# Patient Record
Sex: Male | Born: 1950 | Hispanic: No | Marital: Married | Smoking: Never smoker
Health system: Southern US, Community
[De-identification: ages and names within clinical notes are randomized; demographics above are authoritative.]

## PROBLEM LIST (undated history)

## (undated) DIAGNOSIS — E785 Hyperlipidemia, unspecified: Secondary | ICD-10-CM

## (undated) DIAGNOSIS — M542 Cervicalgia: Secondary | ICD-10-CM

## (undated) DIAGNOSIS — K219 Gastro-esophageal reflux disease without esophagitis: Secondary | ICD-10-CM

## (undated) DIAGNOSIS — M8580 Other specified disorders of bone density and structure, unspecified site: Secondary | ICD-10-CM

## (undated) HISTORY — PX: CARDIAC CATHETERIZATION: SHX172

## (undated) HISTORY — DX: Other specified disorders of bone density and structure, unspecified site: M85.80

## (undated) HISTORY — PX: ROTATOR CUFF REPAIR: SHX139

## (undated) HISTORY — PX: ANKLE SURGERY: SHX546

## (undated) HISTORY — PX: FINGER SURGERY: SHX640

## (undated) HISTORY — DX: Gastro-esophageal reflux disease without esophagitis: K21.9

## (undated) HISTORY — DX: Hyperlipidemia, unspecified: E78.5

## (undated) HISTORY — DX: Cervicalgia: M54.2

---

## 2004-04-19 ENCOUNTER — Ambulatory Visit: Payer: Self-pay | Admitting: Internal Medicine

## 2008-05-28 ENCOUNTER — Ambulatory Visit: Payer: Self-pay | Admitting: Internal Medicine

## 2009-02-25 ENCOUNTER — Ambulatory Visit: Payer: Self-pay | Admitting: Neurology

## 2009-03-01 ENCOUNTER — Ambulatory Visit: Payer: Self-pay | Admitting: Neurology

## 2009-03-04 ENCOUNTER — Ambulatory Visit: Payer: Self-pay | Admitting: Neurology

## 2009-04-23 ENCOUNTER — Ambulatory Visit: Payer: Self-pay | Admitting: Psychology

## 2011-09-25 ENCOUNTER — Ambulatory Visit
Admission: RE | Admit: 2011-09-25 | Discharge: 2011-09-25 | Disposition: A | Discharge status: Home / Self Care | Payer: BC Managed Care – PPO | Source: Ambulatory Visit | Attending: Chiropractic Medicine | Admitting: Chiropractic Medicine

## 2011-09-25 ENCOUNTER — Other Ambulatory Visit: Payer: Self-pay | Admitting: Chiropractic Medicine

## 2011-09-25 DIAGNOSIS — M5412 Radiculopathy, cervical region: Secondary | ICD-10-CM

## 2011-09-25 DIAGNOSIS — M542 Cervicalgia: Secondary | ICD-10-CM

## 2012-11-15 ENCOUNTER — Ambulatory Visit: Payer: Self-pay | Admitting: Orthopedic Surgery

## 2013-05-05 DIAGNOSIS — R413 Other amnesia: Secondary | ICD-10-CM

## 2013-06-05 ENCOUNTER — Ambulatory Visit: Payer: Self-pay | Admitting: Orthopedic Surgery

## 2013-06-18 ENCOUNTER — Ambulatory Visit: Payer: Self-pay | Admitting: Orthopedic Surgery

## 2013-06-18 LAB — CBC
HCT: 45.1 % (ref 40.0–52.0)
HGB: 14.8 g/dL (ref 13.0–18.0)
MCH: 28.5 pg (ref 26.0–34.0)
MCHC: 32.8 g/dL (ref 32.0–36.0)
MCV: 87 fL (ref 80–100)
PLATELETS: 273 10*3/uL (ref 150–440)
RBC: 5.18 10*6/uL (ref 4.40–5.90)
RDW: 14.2 % (ref 11.5–14.5)
WBC: 7.1 10*3/uL (ref 3.8–10.6)

## 2013-06-18 LAB — URINALYSIS, COMPLETE
BACTERIA: NONE SEEN
Bilirubin,UR: NEGATIVE
Blood: NEGATIVE
GLUCOSE, UR: NEGATIVE mg/dL (ref 0–75)
Ketone: NEGATIVE
LEUKOCYTE ESTERASE: NEGATIVE
Nitrite: NEGATIVE
PH: 6 (ref 4.5–8.0)
Protein: NEGATIVE
RBC, UR: NONE SEEN /HPF (ref 0–5)
SPECIFIC GRAVITY: 1.016 (ref 1.003–1.030)
SQUAMOUS EPITHELIAL: NONE SEEN
WBC UR: NONE SEEN /HPF (ref 0–5)

## 2013-06-18 LAB — BASIC METABOLIC PANEL
Anion Gap: 5 — ABNORMAL LOW (ref 7–16)
BUN: 22 mg/dL — AB (ref 7–18)
CALCIUM: 8.9 mg/dL (ref 8.5–10.1)
CHLORIDE: 106 mmol/L (ref 98–107)
CREATININE: 0.82 mg/dL (ref 0.60–1.30)
Co2: 26 mmol/L (ref 21–32)
EGFR (Non-African Amer.): >60
GLUCOSE: 93 mg/dL (ref 65–99)
Osmolality: 277 (ref 275–301)
Potassium: 3.9 mmol/L (ref 3.5–5.1)
Sodium: 137 mmol/L (ref 136–145)

## 2013-06-18 LAB — PROTIME-INR
INR: 0.9
Prothrombin Time: 11.8 secs (ref 11.5–14.7)

## 2013-06-18 LAB — APTT: ACTIVATED PTT: 28.5 s (ref 23.6–35.9)

## 2013-06-25 ENCOUNTER — Ambulatory Visit: Payer: Self-pay | Admitting: Orthopedic Surgery

## 2013-07-03 ENCOUNTER — Ambulatory Visit: Payer: Self-pay | Admitting: Pain Medicine

## 2013-07-23 ENCOUNTER — Ambulatory Visit: Payer: Self-pay | Admitting: Specialist

## 2014-06-27 NOTE — Op Note (Signed)
PATIENT NAME:  Kyle Mack, Kyle Mack MR#:  [id number] DATE OF BIRTH:  April 15, 1950  DATE OF PROCEDURE:  07/23/2013  PREOPERATIVE DIAGNOSIS:  Comminuted intra-articular fracture of right ring finger, the DIP joint distal phalanx.   POSTOPERATIVE DIAGNOSIS:  Comminuted intra-articular fracture of right ring finger, the DIP joint distal phalanx.   OPERATION: Open reduction internal fixation intra-articular fracture right distal phalanx, right ring finger    SURGEON: Valinda HoarHoward E. Acelin Ferdig, MD  ANESTHESIA: IV regional.   COMPLICATIONS: None.   DRAINS: None.   OPERATIVE PROCEDURE: The patient was brought to the operating room where he underwent satisfactory IV regional anesthesia in the right arm. The right arm was prepped and draped in sterile fashion and a Z-shaped incision was made over the volar aspect of the ring finger starting from the PIP flexion crease ulnarly going radially to the DIP joint and then ulnarly out to the tip of the finger. Blunt dissection was carried out in the midline and the neurovascular bundle radially was identified and protected. The fracture fragment from the base of the distal phalanx was visualized along with its profundus tendon attachment. This was irrigated and suctioned free of clot. A curette was used to free this up. The remainder of the distal phalanx had a vertical fracture but this was anatomic.  About 50% of the joint surface was contained on the volar fragment. A 0.45 K wire was drilled distally to proximally across the distal phalanx down into the middle phalanx to stabilize the phalanx. Two Mellody DanceKeith needles were then drilled through the avulsed volar fragment and then drilled a 2-0 Prolene suture brought up through this fragment with the FarleyKeith needles. The BurleyKeith needles were then drilled out through the distal portion of the phalanx up through the nail bed and the Prolene sutures were again brought up through this with needles over the top of the finger. By doing so, we were  able to pull the displaced fragment back into anatomic position in its bed. The Prolene suture was then tied over a felt protector and button bringing the avulsed volar fragment securely into position. Fluoroscopy showed the fracture fragments and pinned to be in excellent position. The pin was bent over, cut, and buried. The wound was irrigated several times during the procedure. The tourniquet was deflated with good return of blood flow to the hand at this point prior to closure and patient had good bleeding at the tip of the finger. The main wound was then closed with 5-0 nylon sutures.  After that, Marcaine was placed at the MP level to block the finger for postoperative pain release. A dry sterile dressing with a dorsal splint was applied. The patient was transferred to a stretcher and taken to recovery in good condition.    ____________________________ Valinda HoarHoward E. Jan Walters, MD hem:dd D: 07/23/2013 18:06:14 ET T: 07/23/2013 20:55:48 ET JOB#: [id number]  cc: Valinda HoarHoward E. Kenyatte Gruber, MD, <Dictator> Valinda HoarHOWARD E Oluwaferanmi Wain MD ELECTRONICALLY SIGNED 07/24/2013 1:47

## 2014-06-27 NOTE — Op Note (Signed)
PATIENT NAME:  Kyle Mack, Kyle Mack MR#:  [id number] DATE OF BIRTH:  February 06, 1951  DATE OF PROCEDURE:  06/25/2013  PREOPERATIVE DIAGNOSES: Left shoulder rotator cuff tear, impingement and acromioclavicular joint arthrosis.   POSTOPERATIVE DIAGNOSIS: Left shoulder rotator cuff tear, impingement and acromioclavicular joint arthrosis.   PROCEDURES PERFORMED:  Left shoulder arthroscopy, subacromial decompression, distal clavicle excision and mini-open rotator cuff repair.   ANESTHESIA:  General endotracheal intubation with local injection of 1% lidocaine plain at the incision sites and 0.25% Marcaine plain at the conclusion of the case.   SURGEON:  Thornton Park, M.D.   ESTIMATED BLOOD LOSS:  Minimal.   COMPLICATIONS:  None.  IMPLANTS:  ArthroCare Opus Magnum 2 anchors x 4 and Magnum M anchors x 2.   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 He has had persistent left shoulder pain which has not been responsive to conservative management.  An MRI has confirmed a full-thickness tear of the left supraspinatus. The patient wished to proceed with surgery. I reviewed the risks and benefits of surgery with the patient prior to the date of operation.   PROCEDURE NOTE:  The patient was met in the preoperative area. I marked the word "yes" over the left shoulder.  I updated the patient's history and physical. I answered all questions by the patient and his wife. The patient was brought to the operating room where he underwent general endotracheal intubation. A spider arm positioner was used for this case and he was positioned in a beach chair position. Examination under anesthesia was performed demonstrating no instability and full passive range of motion.   The patient was prepped and draped in a sterile fashion. A timeout was performed to verify the patient's name, date of birth, medical record number, correct side of the surgery and  correct procedure to be performed. It was also used to verify the patient had received the antibiotics and that all appropriate instruments, implants and radiographic studies were available in the room. Once all who were in attendance were in agreement, the case began.   The patient had his anatomic landmarks drawn out with a surgical marker. This included proposed arthroscopy incisions. These were preinjected with 1% lidocaine plain. An 11 blade was used to create the posterior portal through which the arthroscope was placed into the glenohumeral joint. Under direct visualization, an anterior portal was also created using an 18-gauge spinal needle for localization. A 5.75 mm cannula was placed through the anterior portal. A full diagnostic examination of the shoulder was undertaken.   Findings on arthroscopy included a full-thickness tear of the rotator cuff. The biceps tendon was intact. There was mild fraying of the supraspinatus without full-thickness tear. There was no significant glenoid or humeral head chondral lesions. There were no loose bodies in the inferior recess. There was no evidence of labral tear.   Arthroscopic photos of the diagnostic exam were taken. The arthroscope was then placed into the subacromial space. The patient had significant bursitis. A lateral portal was created, again using an 18-gauge spinal needle for localization. A bursectomy was performed to allow for visualization. A subacromial decompression was then performed using a 5.5 mm resector shaver blade through the lateral portal.  The resector shaver blade was then placed through the anterior portal and a distal clavicle excision was undertaken for [facility] joint arthrosis. Through the lateral portal 2 Smart stitches were placed along the lateral edge of the rotator cuff.   All arthroscopic  instruments were then removed. A saber-type incision was made along the lateral border of the acromion. The deltoid was then identified and  split in line with its fibers. The Smart stitches were brought out through the deltoid split incision. A shoulder retractor was placed to allow for adequate visualization. A third Smart stitch was placed in the lateral border of the rotator cuff. Two Magnum M anchors were then placed along the articular margin of the humeral head. This was after the greater tuberosity was debrided using a 5.5 mm resector shaver blade. The sutures for the Magnum M anchors were then passed through the medial aspect of the cuff. Then, the 3 Magnum 2 anchors were used to affix the Smart stitches for lateral row fixation. The Magnum M anchor sutures were then tied in place for medial row fixation. The double row construct was then completed. Final arthroscopic images were taken. The shoulder was rotated and the construct moved as a single unit. There was no evidence of dog ears at the edges of the cuff repair. The wound was copiously irrigated. The deltoid fascia was closed with 0 interrupted Vicryl, the subcutaneous tissue closed with 2-0 Vicryl including all 3 portals. The portal incisions were closed 4-0 nylon and the saber incision skin was closed with a running 4-0 Monocryl.   A dry sterile dressing was applied, TENS unit was also applied, along with a Polar Care sleeve  and an abduction sling. The patient was then awoken and brought to the PACU in stable condition.   I was scrubbed and present for the entire case, and all sharp and instrument counts were correct at the conclusion of the case. I spoke with the patient's wife postoperatively to let her know that the case had gone without complication and the patient was stable in the recovery room.    ____________________________ Timoteo Gaul, MD klk:dmm D: 07/02/2013 12:53:59 ET T: 07/02/2013 13:41:12 ET JOB#: [id number]  cc: Timoteo Gaul, MD, <Dictator> Timoteo Gaul MD ELECTRONICALLY SIGNED 07/07/2013 7:14

## 2015-02-08 ENCOUNTER — Other Ambulatory Visit: Payer: Self-pay | Admitting: Pain Medicine

## 2015-02-08 ENCOUNTER — Encounter: Payer: Self-pay | Admitting: Pain Medicine

## 2015-02-08 DIAGNOSIS — M4722 Other spondylosis with radiculopathy, cervical region: Secondary | ICD-10-CM

## 2015-02-08 DIAGNOSIS — M79603 Pain in arm, unspecified: Secondary | ICD-10-CM

## 2015-02-08 DIAGNOSIS — M542 Cervicalgia: Secondary | ICD-10-CM

## 2015-02-08 DIAGNOSIS — G8929 Other chronic pain: Secondary | ICD-10-CM | POA: Insufficient documentation

## 2015-02-08 DIAGNOSIS — M5412 Radiculopathy, cervical region: Secondary | ICD-10-CM

## 2015-02-15 ENCOUNTER — Ambulatory Visit: Payer: Self-pay | Admitting: Pain Medicine

## 2015-02-17 ENCOUNTER — Ambulatory Visit
Admission: RE | Admit: 2015-02-17 | Discharge: 2015-02-17 | Disposition: A | Discharge status: Home / Self Care | Payer: Medicare Other | Source: Ambulatory Visit | Attending: Pain Medicine | Admitting: Pain Medicine

## 2015-02-17 DIAGNOSIS — M4802 Spinal stenosis, cervical region: Secondary | ICD-10-CM | POA: Insufficient documentation

## 2015-02-17 DIAGNOSIS — M5412 Radiculopathy, cervical region: Secondary | ICD-10-CM | POA: Diagnosis present

## 2015-02-17 DIAGNOSIS — M542 Cervicalgia: Secondary | ICD-10-CM | POA: Insufficient documentation

## 2015-02-17 DIAGNOSIS — M4722 Other spondylosis with radiculopathy, cervical region: Secondary | ICD-10-CM

## 2015-02-18 ENCOUNTER — Ambulatory Visit: Payer: Medicare Other | Attending: Pain Medicine | Admitting: Pain Medicine

## 2015-02-18 ENCOUNTER — Encounter: Payer: Self-pay | Admitting: Pain Medicine

## 2015-02-18 ENCOUNTER — Telehealth: Payer: Self-pay

## 2015-02-18 VITALS — BP 110/72 | HR 66 | Temp 98.7°F | Resp 16 | Ht 68.0 in | Wt 200.0 lb

## 2015-02-18 DIAGNOSIS — M8580 Other specified disorders of bone density and structure, unspecified site: Secondary | ICD-10-CM | POA: Insufficient documentation

## 2015-02-18 DIAGNOSIS — M542 Cervicalgia: Secondary | ICD-10-CM | POA: Insufficient documentation

## 2015-02-18 DIAGNOSIS — M5412 Radiculopathy, cervical region: Secondary | ICD-10-CM

## 2015-02-18 DIAGNOSIS — M4802 Spinal stenosis, cervical region: Secondary | ICD-10-CM

## 2015-02-18 DIAGNOSIS — M792 Neuralgia and neuritis, unspecified: Secondary | ICD-10-CM

## 2015-02-18 DIAGNOSIS — R937 Abnormal findings on diagnostic imaging of other parts of musculoskeletal system: Secondary | ICD-10-CM

## 2015-02-18 DIAGNOSIS — M47812 Spondylosis without myelopathy or radiculopathy, cervical region: Secondary | ICD-10-CM | POA: Insufficient documentation

## 2015-02-18 DIAGNOSIS — M4722 Other spondylosis with radiculopathy, cervical region: Secondary | ICD-10-CM | POA: Insufficient documentation

## 2015-02-18 DIAGNOSIS — M25512 Pain in left shoulder: Secondary | ICD-10-CM | POA: Insufficient documentation

## 2015-02-18 DIAGNOSIS — M47892 Other spondylosis, cervical region: Secondary | ICD-10-CM

## 2015-02-18 DIAGNOSIS — R52 Pain, unspecified: Secondary | ICD-10-CM | POA: Diagnosis present

## 2015-02-18 DIAGNOSIS — M79602 Pain in left arm: Secondary | ICD-10-CM

## 2015-02-18 DIAGNOSIS — G8929 Other chronic pain: Secondary | ICD-10-CM | POA: Diagnosis not present

## 2015-02-18 DIAGNOSIS — K219 Gastro-esophageal reflux disease without esophagitis: Secondary | ICD-10-CM | POA: Diagnosis not present

## 2015-02-18 NOTE — Progress Notes (Signed)
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 His primarily concern today is the Neck Pain and Pain     The patient returns to the clinic today to discuss the results of his recent 02/17/2015 cervical MRI, as well as possible alternatives to treating his left shoulder pain and neck pain. The MRI shows foraminal stenosis at the C3-4 level on the right side, bilateral at the C5-6 level with the right being worst on the left, and on the left side at the C6-7 level. Because his primary pain is over the left posterior trapezius and shoulder area, this would suggest that if foraminal stenosis was involved it would be from the C5-6 and/or C6-7. This was suggested involvement of the C6 or C7 nerve roots, but the pain does not seem to go all the way down into his arm.  A second possibility would be that this pain is referred from his facet joints. For this reason, today I have put at the possibility that we may need to do a diagnostic left sided cervical facet block under fluoroscopic guidance to see if this is much contributing to the pain. Should he get good relief of the pain for the duration of local anesthetic, he may be a good candidate for radiofrequency ablation of  the cervical medial branch nerves.  To be certain that this is not something that can be surgically fixed, we will request a consult with Dr. Hilda LiasErnesto Botero for evaluation of his case and MRI.  Today's Pain Score: 2  Reported level of pain is compatible with clinical observation Pain Type: Chronic pain Pain Descriptors / Indicators: Dull Pain Frequency: Intermittent   Pharmacotherapy Review: The patient is currently using extended release gabapentin (Horizon) to manage his pain. Side-effects or Adverse reactions: None reported Effectiveness: Described as relatively effective, allowing for increase in activities of daily living (ADL) Duration of action: Within normal limits for medication Treatment compliance: Compliant Substance Use Disorder (SUD) Risk Level: Low Pharmacologic Plan: Continue therapy as is  Lab Work: Inflammation Markers No results found for: ESRSEDRATE, CRP  Renal Function Lab Results  Component Value Date   BUN 22* 06/18/2013   CREATININE 0.82 06/18/2013   GFRAA >60 06/18/2013   GFRNONAA >60 06/18/2013    Hepatic Function No results found for: AST, ALT, ALBUMIN  Electrolytes Lab Results  Component Value Date   NA 137 06/18/2013   K 3.9 06/18/2013   CL 106 06/18/2013   CALCIUM 8.9 06/18/2013    Illicit Drugs No results found for: THCU, COCAINSCRNUR, PCPSCRNUR, MDMA, AMPHETMU, METHADONE, ETOH  Allergies:  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  Meds:  The patient has a current medication list which includes the following prescription(s): vitamin c, aspirin ec, calcium-vitamin d3, esomeprazole, gabapentin, rosuvastatin, vitamin d (ergocalciferol), and ciprofloxacin. 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 Family: family history includes COPD in his mother; Heart disease in his father. Surgical:  has past surgical history that includes Rotator cuff repair (Left); Ankle surgery (Right); and Finger surgery (Right, 4th digit). Tobacco:  reports that he has never smoked. He does not have any smokeless tobacco history on file. Alcohol:  reports that he does not drink alcohol. Drug:  reports that he does not use illicit drugs.  Physical Exam:  Vitals:  Today's Vitals   02/18/15 1304 02/18/15 1307  BP: 110/72   Pulse: 66   Temp: 98.7 F (37.1 C)   TempSrc: Oral   Resp: 16   Height:  (1.727 m)   Weight: 200 lb (90.719 kg)   SpO2: 98%   PainSc:  2   Calculated BMI: Body mass index is 30.42 kg/(m^2). General appearance: alert, cooperative, appears stated age and no distress Eyes: PERLA Respiratory: No evidence respiratory distress, no audible rales or ronchi and no use of accessory muscles of respiration Neck: no adenopathy, no carotid bruit, no JVD, supple, symmetrical, trachea midline and thyroid not enlarged, symmetric, no tenderness/mass/nodules  Cervical Spine ROM: Decreased range of motion with rotation as well as lateral bending, secondary to mechanical restrictions of his cervical spine. He indicates that he has noticed that his left shoulder pain improves with tilting of his head towards the right side (right lateral bend). In addition he indicates that rotating his head towards the right side for periods of longer than 10 minutes does worsen his left shoulder pain.  Upper Extremities ROM: Adequate bilaterally Neurologic: No reported sensory abnormalities.  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  Earley was seen today for neck pain and pain.  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) (Bilateral, R>L: C5-6) (Left: C6-7) -     Ambulatory referral to Neurosurgery -     CERVICAL EPIDURAL STEROID INJECTION; Standing  Chronic radicular cervical pain  Chronic pain of left upper extremity  Chronic neck pain (Location of Primary Source of Pain) (Bilateral) (L>R)  Cervical spondylosis with radiculopathy (sensory) (Left)  Cervical facet hypertrophy (Right C2-3) (Bilateral, L>R: C6-7) (Bilateral, R>L: C7-T1) -     CERVICAL FACET (MEDIAL BRANCH NERVE BLOCK) ; Standing  Abnormal MRI, cervical spine (02/17/2015)  Neuropathic pain     There are no Patient Instructions on file for this visit. Medications discontinued today:  There are no discontinued medications. Medications administered today:  Mr. Akhtar does not currently have medications on file.  Primary Care Physician: Sherrie Mustache, MD Location: [facility] Note by: Sydnee Levans. Laban Emperor, M.D, DABA, DABAPM, DABPM, DABIPP, FIPP

## 2015-02-18 NOTE — Progress Notes (Deleted)
   Subjective:    Patient ID: Kyle Mack Kyle Mack, male    DOB: 05-31-1950, 64 y.o.   MRN: [id number]  HPI    Review of Systems     Objective:   Physical Exam        Assessment & Plan:

## 2015-02-18 NOTE — Progress Notes (Signed)
Safety precautions to be maintained throughout the outpatient stay will include: orient to surroundings, keep bed in low position, maintain call bell within reach at all times, provide assistance with transfer out of bed and ambulation.  

## 2016-07-03 ENCOUNTER — Other Ambulatory Visit: Payer: Self-pay | Admitting: Pain Medicine

## 2016-07-03 DIAGNOSIS — G8929 Other chronic pain: Secondary | ICD-10-CM

## 2016-07-03 DIAGNOSIS — M792 Neuralgia and neuritis, unspecified: Secondary | ICD-10-CM

## 2016-07-03 DIAGNOSIS — M47812 Spondylosis without myelopathy or radiculopathy, cervical region: Secondary | ICD-10-CM

## 2016-07-03 DIAGNOSIS — M542 Cervicalgia: Secondary | ICD-10-CM

## 2016-07-03 DIAGNOSIS — G894 Chronic pain syndrome: Secondary | ICD-10-CM | POA: Insufficient documentation

## 2016-07-03 MED ORDER — GABAPENTIN ENACARBIL ER 600 MG PO TBCR: EXTENDED_RELEASE_TABLET | ORAL | 5 refills | Status: DC

## 2016-07-03 NOTE — Progress Notes (Unsigned)
The patient was doing great on his Horizant (Long-acting gabapentin) 600 mg in AM and 1200 mg in PM, but the pharmaceutical company no longer supports the financial assistance program and his cost is going up from $120/mo to $1,200/mo. He indicates not being able to afford it at that price. I have given him a prescription for the generic form of it to see if his insurance will call over it. I have also renewed his PRN

## 2016-07-10 ENCOUNTER — Encounter: Payer: Self-pay | Admitting: Nurse Practitioner

## 2016-07-10 ENCOUNTER — Ambulatory Visit: Payer: Medicare Other | Admitting: Nurse Practitioner

## 2016-07-10 ENCOUNTER — Ambulatory Visit: Payer: Medicare Other | Attending: Nurse Practitioner | Admitting: Nurse Practitioner

## 2016-07-10 VITALS — BP 106/66 | HR 69 | Temp 98.0°F | Resp 16 | Ht 68.0 in | Wt 186.0 lb

## 2016-07-10 DIAGNOSIS — M9981 Other biomechanical lesions of cervical region: Secondary | ICD-10-CM | POA: Diagnosis not present

## 2016-07-10 DIAGNOSIS — M792 Neuralgia and neuritis, unspecified: Secondary | ICD-10-CM

## 2016-07-10 DIAGNOSIS — G894 Chronic pain syndrome: Secondary | ICD-10-CM | POA: Insufficient documentation

## 2016-07-10 DIAGNOSIS — E663 Overweight: Secondary | ICD-10-CM | POA: Insufficient documentation

## 2016-07-10 DIAGNOSIS — M4722 Other spondylosis with radiculopathy, cervical region: Secondary | ICD-10-CM | POA: Insufficient documentation

## 2016-07-10 DIAGNOSIS — Z79899 Other long term (current) drug therapy: Secondary | ICD-10-CM | POA: Insufficient documentation

## 2016-07-10 DIAGNOSIS — M25512 Pain in left shoulder: Secondary | ICD-10-CM | POA: Diagnosis not present

## 2016-07-10 DIAGNOSIS — M542 Cervicalgia: Secondary | ICD-10-CM

## 2016-07-10 DIAGNOSIS — M4802 Spinal stenosis, cervical region: Secondary | ICD-10-CM | POA: Diagnosis not present

## 2016-07-10 DIAGNOSIS — G8929 Other chronic pain: Secondary | ICD-10-CM

## 2016-07-10 NOTE — Progress Notes (Signed)
Nursing Pain Medication Assessment:  Safety precautions to be maintained throughout the outpatient stay will include: orient to surroundings, keep bed in low position, maintain call bell within reach at all times, provide assistance with transfer out of bed and ambulation.  Medication Inspection Compliance: Pill count conducted under aseptic conditions, in front of the patient. Neither the pills nor the bottle was removed from the patient's sight at any time. Once count was completed pills were immediately returned to the patient in their original bottle.   

## 2016-07-10 NOTE — Patient Instructions (Addendum)

## 2016-07-10 NOTE — Progress Notes (Signed)
Patient's Name: Kyle Mack  MRN: [id number]  Referring Provider: Casilda Carls  DOB: 03/10/1950  PCP: Casilda Carls  DOS: 07/10/2016  Note by: Vevelyn Francois NP  Service setting: Ambulatory outpatient  Specialty: Interventional Pain Management  Location: [facility] (AMB) Pain Management Facility    Patient type: Established    Primary Reason(s) for Visit: Encounter for prescription drug management (Level of risk: moderate) CC: Neck Pain (mid- left C3-4)  HPI  Kyle Mack is a 66 y.o. year old, male patient, who comes today for a medication management evaluation. 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 His primarily concern today is the Neck Pain (mid- left C3-4)  Pain Assessment: Self-Reported Pain Score: 3 /10             Reported level is compatible with observation.       Pain Type: Chronic pain Pain Location: Neck Pain Orientation: Left Pain Descriptors / Indicators: Constant, Tiring (worse when eating or typing) Pain Frequency: Constant  Kyle Mack was last scheduled for an appointment on 02/18/15 for medication management. During today's appointment we reviewed Kyle Mack chronic pain status, as well as his outpatient medication regimen. He has chronic neck pain. He feels like his cervical stenosis is getting worse. He states that he has left cervical radiculopathy C5-6. He admits that the pain is increased with computer work, typing and just having to hold his head in  certain positions. This is why he only works part-time. He is currently on Horizant BID. He admits that this is very effective in controlling his pain. He states he only has breakthrough pain 3 times per month. However on gabapentin alone he was having to take it QID. It was causing fatigue and he was having a lot of breakthrough pain. He has started Glucosamine and chondroitin secondary to loss in height.  He may have to get the cervical facet block if he is not able to remain on his medication. He admits that he can not afford the Horizant without insurance assistance.   The patient  reports that he does not use drugs. His body mass index is 28.28 kg/m.  Further details on both, my assessment(s), as well as the proposed treatment plan, please see below.  Controlled Substance Pharmacotherapy Assessment REMS (Risk Evaluation and Mitigation Strategy)  Clide Cliff, RN  07/10/2016  8:23 AM  Sign at close encounter Nursing Pain Medication Assessment:  Safety precautions to be maintained throughout the outpatient stay will include: orient to surroundings, keep bed in low position, maintain call bell within reach at all times, provide assistance with transfer out of bed and ambulation.  Medication Inspection Compliance: Pill count conducted under aseptic conditions, in front of the patient. Neither the pills nor the bottle was removed from the patient's sight at any time. Once count was completed pills were immediately returned to the patient in their original bottle.  Pharmacokinetics: Liberation and absorption (onset of action): WNL Distribution (time to peak effect): WNL Metabolism and excretion (duration of action): WNL         Pharmacodynamics: Desired effects: Analgesia: Kyle Mack reports >  50% benefit. Functional ability: Patient reports that medication allows him to accomplish basic ADLs Clinically meaningful improvement in function (CMIF): Sustained CMIF goals met Perceived  effectiveness: Described as relatively effective, allowing for increase in activities of daily living (ADL) Undesirable effects: Side-effects or Adverse reactions: None reported Monitoring: [place] PMP: Online review of the past 37-monthperiod conducted. Compliant with practice rules and regulations List of all UDS test(s) done:  No results found for: TOXASSSELUR, SUMMARY Last UDS on record: No results found for: TOXASSSELUR UDS interpretation: Compliant          Medication Assessment Form: Reviewed. Patient indicates being compliant with therapy Treatment compliance: Compliant Risk Assessment Profile: Aberrant behavior: See prior evaluations. None observed or detected today Comorbid factors increasing risk of overdose: See prior notes. No additional risks detected today Risk of substance use disorder (SUD): Low Opioid Risk Tool (ORT) Total Score: 0  Interpretation Table:  Score <3 = Low Risk for SUD  Score between 4-7 = Moderate Risk for SUD  Score >8 = High Risk for Opioid Abuse   Risk Mitigation Strategies:  Patient Counseling: Covered Patient-Prescriber Agreement (PPA): Present and active  Notification to other healthcare providers: Done  Pharmacologic Plan: No change in therapy, at this time  Laboratory Chemistry  Inflammation Markers No results found for: CRP, ESRSEDRATE (CRP: Acute Phase) (ESR: Chronic Phase) Renal Function Markers Lab Results  Component Value Date   BUN 22 (H) 06/18/2013   CREATININE 0.82 06/18/2013   GFRAA >60 06/18/2013   GFRNONAA >60 06/18/2013   Hepatic Function Markers No results found for: AST, ALT, ALBUMIN, ALKPHOS, HCVAB Electrolytes Lab Results  Component Value Date   NA 137 06/18/2013   K 3.9 06/18/2013   CL 106 06/18/2013   CALCIUM 8.9 06/18/2013   Neuropathy Markers No results found for: [id number] Pathology Markers Lab Results  Component Value Date   CALCIUM 8.9 06/18/2013   Coagulation Parameters Lab Results   Component Value Date   INR 0.9 06/18/2013   LABPROT 11.8 06/18/2013   APTT 28.5 06/18/2013   PLT 273 06/18/2013   Cardiovascular Markers Lab Results  Component Value Date   HGB 14.8 06/18/2013   HCT 45.1 06/18/2013   Note: Lab results reviewed.  Recent Diagnostic Imaging Review  Mr Cervical Spine Wo Contrast  Result Date: 02/17/2015 CLINICAL DATA:  Neck pain, worse on the left.  Left shoulder pain. EXAM: MRI CERVICAL SPINE WITHOUT CONTRAST TECHNIQUE: Multiplanar, multisequence MR imaging of the cervical spine was performed. No intravenous contrast was administered. COMPARISON:  MRI of the cervical spine 06/05/2013 FINDINGS: Normal signal is present in the cervical and upper thoracic spinal cord to the lowest imaged level, T2-3. Marrow signal, vertebral body heights, alignment are normal. The craniocervical junction is within normal limits. The visualized intracranial contents are normal. C2-3: Asymmetric right-sided facet hypertrophy is present. There is no significant stenosis. C3-4: A rightward disc osteophyte complex is present. Uncovertebral spurring is present on the right. This results in mild to moderate right foraminal narrowing. Central canal is patent. C4-5: Minimal uncovertebral spurring is present without significant stenosis. C5-6: Uncovertebral spurring is evident bilaterally. This results in moderate foraminal stenosis, right greater than left. C6-7: Advanced facet hypertrophy is present bilaterally. Uncovertebral spurring on the left contributes to mild left foraminal narrowing. The right foramen is patent. C7-T1: Moderate facet hypertrophy is worse on the right. There is no significant stenosis. IMPRESSION: 1. Mild to moderate right foraminal narrowing at C3-4 is not significantly changed. 2. Moderate foraminal  stenosis at C5-6 demonstrates slight progression. Stenosis is worse on the right. 3. Mild left foraminal narrowing at C6-7 is stable. Electronically Signed   By:  San Morelle M.D.   On: 02/17/2015 15:25   Note: Imaging results reviewed.          Meds  The patient has a current medication list which includes the following prescription(s): aspirin ec, calcium-vitamin d3, dutasteride, gabapentin enacarbil, mirabegron er, rosuvastatin, and vitamin d (ergocalciferol).  Current Outpatient Prescriptions on File Prior to Visit  Medication Sig  . aspirin EC 81 MG tablet Take 81 mg by mouth daily.  . Calcium Carbonate-Vitamin D (CALCIUM-VITAMIN D3) 600-125 MG-UNIT TABS Take by mouth daily.   . Gabapentin Enacarbil 600 MG TBCR Take 600 mg in AM and 1200 mg in PM with meals.  . rosuvastatin (CRESTOR) 10 MG tablet Take 10 mg by mouth 3 (three) times a week.  . Vitamin D, Ergocalciferol, (DRISDOL) 50000 UNITS CAPS capsule Take 50,000 Units by mouth every 7 (seven) days.   No current facility-administered medications on file prior to visit.    ROS  Constitutional: Denies any fever or chills Gastrointestinal: No reported hemesis, hematochezia, vomiting, or acute GI distress Musculoskeletal: Denies any acute onset joint swelling, redness, loss of ROM, or weakness Neurological: No reported episodes of acute onset apraxia, aphasia, dysarthria, agnosia, amnesia, paralysis, loss of coordination, or loss of consciousness  Allergies  Mr. Montz is allergic to alendronate; biphosphate; and nsaids.  [place]  Drug: Mr. Eckerson  reports that he does not use drugs. Alcohol:  reports that he does not drink alcohol. Tobacco:  reports that he has never smoked. He does not have any smokeless tobacco history on file. Medical:  has a past medical history of Cervical spine pain; GERD (gastroesophageal reflux disease); and Osteopenia. Family: family history includes COPD in his mother; Heart disease in his father.  Past Surgical History:  Procedure Laterality Date  . ANKLE SURGERY Right   . FINGER SURGERY Right 4th digit  . ROTATOR CUFF REPAIR Left    Constitutional  Exam  General appearance: Well nourished, well developed, and well hydrated. In no apparent acute distress Vitals:   07/10/16 0810  BP: 106/66  Pulse: 69  Resp: 16  Temp: 98 F (36.7 C)  TempSrc: Oral  SpO2: 98%  Weight: 186 lb (84.4 kg)  Height: 5' 8"  (1.727 m)   BMI Assessment: Estimated body mass index is 28.28 kg/m as calculated from the following:   Height as of this encounter: 5' 8"  (1.727 m).   Weight as of this encounter: 186 lb (84.4 kg).  BMI interpretation table: BMI level Category Range association with higher incidence of chronic pain  <18 kg/m2 Underweight   18.5-24.9 kg/m2 Ideal body weight   25-29.9 kg/m2 Overweight Increased incidence by 20%  30-34.9 kg/m2 Obese (Class I) Increased incidence by 68%  35-39.9 kg/m2 Severe obesity (Class II) Increased incidence by 136%  >40 kg/m2 Extreme obesity (Class III) Increased incidence by 254%   BMI Readings from Last 4 Encounters:  07/10/16 28.28 kg/m  02/18/15 30.41 kg/m   Wt Readings from Last 4 Encounters:  07/10/16 186 lb (84.4 kg)  02/18/15 200 lb (90.7 kg)  Psych/Mental status: Alert, oriented x 3 (person, place, & time)       Eyes: PERLA Respiratory: No evidence of acute respiratory distress  Cervical Spine Exam  Inspection: No masses, redness, or swelling Alignment: Symmetrical Functional ROM: Decreased ROM      Stability: No instability  detected Muscle strength & Tone: Functionally intact Sensory: Unimpaired Palpation: No palpable anomalies              Upper Extremity (UE) Exam    Side: Right upper extremity  Side: Left upper extremity  Inspection: No masses, redness, swelling, or asymmetry. No contractures  Inspection: No masses, redness, swelling, or asymmetry. No contractures  Functional ROM: Unrestricted ROM          Functional ROM: Unrestricted ROM          Muscle strength & Tone: Functionally intact  Muscle strength & Tone: Functionally intact  Sensory: Unimpaired  Sensory: Unimpaired   Palpation: No palpable anomalies              Palpation: No palpable anomalies              Specialized Test(s): Deferred         Specialized Test(s): Deferred           Gait & Posture Assessment  Ambulation: Unassisted Gait: Relatively normal for age and body habitus Posture: WNL   Assessment  Primary Diagnosis & Pertinent Problem List: The primary encounter diagnosis was Chronic neck pain (Location of Primary Source of Pain) (Bilateral) (L>R). 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  Status Diagnosis  Persistent Persistent Persistent 1. Chronic neck pain (Location of Primary Source of Pain) (Bilateral) (L>R)   2. 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 Cervical spondylosis with radiculopathy (sensory) (Left)   4. Neurogenic pain   5. Chronic pain syndrome   6. Overweight (BMI 25.0-29.9)      Plan of Care  Pharmacotherapy (Medications Ordered): No orders of the defined types were placed in this encounter.  New Prescriptions   No medications on file   Medications administered today: Mr. Busk had no medications administered during this visit. Lab-work, procedure(s), and/or referral(s): No orders of the defined types were placed in this encounter.  Imaging and/or referral(s): None  Interventional therapies: Planned, scheduled, and/or pending:   Not at this time.    Considering:   Diagnostic Cervical Facet Block, Left C3, C4, C5, C6, C7, T1   Palliative PRN treatment(s):   Palliative Cervical Facet Block, Left C3, C4, C5, C6, C7, T1   Provider-requested follow-up: Return in about 3 months (around 10/10/2016) for Medication Mgmt.  No future appointments. Primary Care Physician: Casilda Carls Location: [facility]  Outpatient Pain Management Facility Note by: Vevelyn Francois NP Date: 07/10/2016; Time: 9:01 AM  Pain Score Disclaimer: We use the NRS-11 scale. This is a self-reported, subjective measurement of pain severity with only modest accuracy. It is used primarily to identify changes within a particular patient. It must be understood that outpatient pain scales are significantly less accurate that those used for research, where they can be applied under ideal controlled circumstances with minimal exposure to variables. In reality, the score is likely to be a combination of pain intensity and pain affect, where pain affect describes the degree of emotional arousal or changes in action readiness caused by the sensory experience of pain. Factors such as social and work situation, setting, emotional state, anxiety levels, expectation, and prior pain experience may influence pain perception and show large inter-individual differences that may also be affected by time variables.  Patient instructions provided during this appointment: Patient  Instructions  ____________________________________________________________________________________________  Medication Rules  Applies to: All patients receiving prescriptions (written or electronic).  Pharmacy of record: Pharmacy where electronic prescriptions will be sent. If written prescriptions are taken to a different pharmacy, please inform the nursing staff. The pharmacy listed in the electronic medical record should be the one where you would like electronic prescriptions to be sent.  Prescription refills: Only during scheduled appointments. Applies to both, written and electronic prescriptions.  NOTE: The following applies primarily to controlled substances (Opioid Pain Medications)  Patient's responsibilities: 1. Pain Pills: Bring all pain pills to every appointment (except for procedure appointments). 2. Pill Bottles: Bring pills in original pharmacy bottle.  Always bring newest bottle. Bring bottle, even if empty. 3. Medication refills: You are responsible for knowing and keeping track of what medications you need refilled. The day before your appointment, write a list of all prescriptions that need to be refilled. Bring that list to your appointment and give it to the admitting nurse. Prescriptions will be written only during appointments. If you forget a medication, it will not be "Called in", "Faxed", or "electronically sent". You will need to get another appointment to get these prescribed. 4. Prescription Accuracy: You are responsible for carefully inspecting your prescriptions before leaving our office. Have the discharge nurse carefully go over each prescription with you, before taking them home. Make sure that your name is accurately spelled, that your address is correct. Check the name and dose of your medication to make sure it is accurate. Check the number of pills, and the written instructions to make sure they are clear and accurate. Make sure that you are given enough medication to last until your next medication refill appointment. 5. Taking Medication: Take medication as prescribed. Never take more pills than instructed. Never take medication more frequently than prescribed. Taking less pills or less frequently is permitted and encouraged, when it comes to controlled substances (written prescriptions).  6. Inform other Doctors: Always inform, all of your healthcare providers, of all the medications you take. 7. Pain Medication from other Providers: You are not allowed to accept any additional pain medication from any other Doctor or Healthcare provider. There are two exceptions to this rule. (see below) In the event that you require additional pain medication, you are responsible for notifying us, as stated below. 8. Medication Agreement: You are responsible for carefully reading and following our Medication Agreement. This must be signed before  receiving any prescriptions from our practice. Safely store a copy of your signed Agreement. Violations to the Agreement will result in no further prescriptions. (Additional copies of our Medication Agreement are available upon request.) 9. Laws, Rules, & Regulations: All patients are expected to follow all Federal and [employer], Rules, [employer]. Ignorance of the Laws does not constitute a valid excuse.  Exceptions: There are only two exceptions to the rule of not receiving pain medications from other Healthcare Providers. 1. Exception #1 (Emergencies): In the event of an emergency (i.e.: accident requiring emergency care), you are allowed to receive additional pain medication. However, you are responsible for: As soon as you are able, call our office [phone], at any time of the day or night, and leave a message stating your name, the date and nature of the emergency, and the name and dose of the medication prescribed. In the event that your call is answered by a member of our staff, make sure to document and save the date, time, and the name of the  person that took your information.  2. Exception #2 (Planned Surgery): In the event that you are scheduled by another doctor or dentist to have any type of surgery or procedure, you are allowed (for a period no longer than 30 days), to receive additional pain medication, for the acute post-op pain. However, in this case, you are responsible for picking up a copy of our "Post-op Pain Management for Surgeons" handout, and giving it to your surgeon or dentist. This document is available at our office, and does not require an appointment to obtain it. Simply go to our office during business hours (Monday-Thursday from 8:00 AM to 4:00 PM) (Friday 8:00 AM to 12:00 Noon) or if you have a scheduled appointment with [place], prior to your surgery, and ask for it by name. In addition, you will need to provide [place] with your name, name of your surgeon, type of  surgery, and date of procedure or surgery.  ____________________________________________________________________________

## 2016-07-17 ENCOUNTER — Other Ambulatory Visit: Payer: Self-pay

## 2016-07-20 ENCOUNTER — Telehealth: Payer: Self-pay | Admitting: Nurse Practitioner

## 2016-07-20 ENCOUNTER — Telehealth: Payer: Self-pay | Admitting: *Deleted

## 2016-07-20 NOTE — Telephone Encounter (Signed)
Patient called asking about his Horizant prior auth and wants someone to call him with update.

## 2016-07-20 NOTE — Telephone Encounter (Signed)
Patient came by twice today re; his PA for Horizant.  Patient is upset 1. Because the request sat x 1 week and was not handled.  2.  Because we did not submit the proper dx code in order to secure the drug.  3.  He would like an independent appeal performed in an effort to secure PA.  Spoke with Thad Rangerrystal King, NP and she is going to begin this process this afternoon.

## 2016-07-20 NOTE — Telephone Encounter (Signed)
Patient notified per voicemail that appeal for Horizant was denied.

## 2016-07-24 ENCOUNTER — Telehealth: Payer: Self-pay | Admitting: *Deleted

## 2016-07-24 ENCOUNTER — Encounter: Payer: Self-pay | Admitting: Nurse Practitioner

## 2016-07-24 DIAGNOSIS — M792 Neuralgia and neuritis, unspecified: Secondary | ICD-10-CM

## 2016-07-24 MED ORDER — GABAPENTIN ENACARBIL ER 600 MG PO TBCR: EXTENDED_RELEASE_TABLET | ORAL | 3 refills | Status: DC

## 2016-07-24 NOTE — Telephone Encounter (Signed)
Returned call to [phone]. Left message.Will look for additional phone numbers

## 2016-07-25 ENCOUNTER — Other Ambulatory Visit: Payer: Self-pay | Admitting: Nurse Practitioner

## 2016-07-25 MED ORDER — GABAPENTIN 300 MG PO CAPS: 300.0000 mg | ORAL_CAPSULE | Freq: Four times a day (QID) | ORAL | 3 refills | Status: DC

## 2016-10-10 ENCOUNTER — Encounter: Payer: Medicare Other | Admitting: Nurse Practitioner

## 2018-11-18 ENCOUNTER — Encounter: Payer: Self-pay | Admitting: Cardiovascular Disease

## 2018-11-20 ENCOUNTER — Telehealth: Payer: Self-pay | Admitting: Cardiovascular Disease

## 2018-11-20 NOTE — Telephone Encounter (Signed)
Crystal calling in from Dr. Guerry Bruin office to discuss referral of patient.He is requesting to speak with Dr. Fletcher Anon. Please contact Dr. Rosario Jacks at [phone]

## 2018-11-22 ENCOUNTER — Telehealth (INDEPENDENT_AMBULATORY_CARE_PROVIDER_SITE_OTHER): Payer: Medicare Other | Admitting: Cardiovascular Disease

## 2018-11-22 ENCOUNTER — Other Ambulatory Visit: Payer: Self-pay

## 2018-11-22 ENCOUNTER — Telehealth: Payer: Self-pay

## 2018-11-22 VITALS — BP 115/75 | HR 65 | Temp 96.7°F | Ht 69.0 in | Wt 189.0 lb

## 2018-11-22 DIAGNOSIS — R079 Chest pain, unspecified: Secondary | ICD-10-CM

## 2018-11-22 DIAGNOSIS — R072 Precordial pain: Secondary | ICD-10-CM

## 2018-11-22 DIAGNOSIS — R931 Abnormal findings on diagnostic imaging of heart and coronary circulation: Secondary | ICD-10-CM

## 2018-11-22 DIAGNOSIS — E785 Hyperlipidemia, unspecified: Secondary | ICD-10-CM

## 2018-11-22 DIAGNOSIS — R002 Palpitations: Secondary | ICD-10-CM

## 2018-11-22 MED ORDER — METOPROLOL TARTRATE 25 MG PO TABS: ORAL_TABLET | ORAL | 0 refills | Status: DC

## 2018-11-22 NOTE — Progress Notes (Signed)
Virtual Visit via Video Note   This visit type was conducted due to national recommendations for restrictions regarding the COVID-19 Pandemic (e.g. social distancing) in an effort to limit this patient's exposure and mitigate transmission in our community.  Due to his co-morbid illnesses, this patient is at least at moderate risk for complications without adequate follow up.  This format is felt to be most appropriate for this patient at this time.  All issues noted in this document were discussed and addressed.  A limited physical exam was performed with this format.  Please refer to the patient's chart for his consent to telehealth for [facility].   Date:  11/22/2018   ID:  Kyle Mack, DOB August 09, 1950, MRN [id number]  Patient Location: Home Provider Location: Office  PCP:  Sherrie Mustache, MD  Cardiologist:  No primary care provider on file.  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 Dario Guardian for evaluation of abnormal nuclear stress test.  He reports having cardiac catheterization in 1999 in [place] and was told that it was normal.  At that time, it was done due to chest pain which was later determined to be GI.  He also had prior MRI/MRA of the brain that showed no significant atherosclerosis.  He has known history of hyperlipidemia and strong family history of premature coronary artery disease.  His father died at the age of 58 of myocardial infarction and his brother had myocardial infarction in his late 50s.  His mother had COPD.  He has been on a statin for many years and he is not a smoker.  Recently, he complains of intermittent episodes of palpitations when he lies down on his left side.  This prompted a stress test.  He was able to exercise for 6 minutes and achieved a maximum heart rate 126 bpm.   Perfusion scan showed evidence of inferior and inferoseptal reversible defect suggestive of ischemia in addition to small anterior lateral defect mostly fixed.  These images were done at Dr. Aurelio Brash office but I was able to review them via a standstill image.  These results came as a surprise to the patient who endorses no exertional symptoms.  The patient does not have symptoms concerning for COVID-19 infection (fever, chills, cough, or new shortness of breath).    Past Medical History:  Diagnosis Date   Cervical spine pain    GERD (gastroesophageal reflux disease)    Osteopenia    Past Surgical History:  Procedure Laterality Date   ANKLE SURGERY Right    FINGER SURGERY Right 4th digit   ROTATOR CUFF REPAIR Left      Current Meds  Medication Sig   aspirin EC 81 MG tablet Take 81 mg by mouth daily.   Calcium Carbonate-Vitamin D (CALCIUM-VITAMIN D3) 600-125 MG-UNIT TABS Take by mouth daily.    desonide (DESOWEN) 0.05 % ointment Apply 1 application topically 2 (two) times daily.   dutasteride (AVODART) 0.5 MG capsule Take 0.5 mg by mouth daily.   gabapentin (NEURONTIN) 300 MG capsule Take 1 capsule (300 mg total) by mouth 4 (four) times daily. (Patient taking differently: Take 300 mg by mouth 4 (four) times daily as needed. )   rosuvastatin (CRESTOR) 10 MG tablet Take 10 mg by mouth 3 (three) times a week.   tamsulosin (FLOMAX) 0.4 MG CAPS capsule  Take 0.4 mg by mouth daily.   Vitamin D, Ergocalciferol, (DRISDOL) 50000 UNITS CAPS capsule Take 50,000 Units by mouth every 7 (seven) days.     Allergies:   Alendronate, Biphosphate, and Nsaids   Social History   Tobacco Use   Smoking status: Never Smoker  Substance Use Topics   Alcohol use: No    Alcohol/week: 0.0 standard drinks   Drug use: No     Family Hx: The patient's family history includes COPD in his mother; Heart disease in his father.  ROS:   Please see the history of present illness.     All other  systems reviewed and are negative.   Prior CV studies:   The following studies were reviewed today:  I reviewed the patient's nuclear stress test that was done this week  Labs/Other Tests and Data Reviewed:    EKG:  No ECG reviewed.  Recent Labs: No results found for requested labs within last 8760 hours.   Recent Lipid Panel No results found for: CHOL, TRIG, HDL, CHOLHDL, LDLCALC, LDLDIRECT  Wt Readings from Last 3 Encounters:  11/22/18 189 lb (85.7 kg)  07/10/16 186 lb (84.4 kg)  02/18/15 200 lb (90.7 kg)     Objective:    Vital Signs:  BP 115/75 Comment: taken on 11/18/2018   Pulse 65    Temp (!) 96.7 F (35.9 C)    Ht 5\' 9"  (1.753 m)    Wt 189 lb (85.7 kg)    BMI 27.91 kg/m    VITAL SIGNS:  reviewed GEN:  no acute distress EYES:  sclerae anicteric, EOMI - Extraocular Movements Intact RESPIRATORY:  normal respiratory effort, symmetric expansion SKIN:  no rash, lesions or ulcers. MUSCULOSKELETAL:  no obvious deformities. NEURO:  alert and oriented x 3, no obvious focal deficit PSYCH:  normal affect  ASSESSMENT & PLAN:    1. Abnormal nuclear stress test: I personally reviewed the stress test and I agree that it is abnormal and highly suggestive of inferior wall ischemia in the RCA distribution.  I suspect that the anterolateral fixed defect is likely an artifact.  His ejection fraction was normal.  Overall moderate risk study.  Surprisingly, the patient does not describe significant anginal symptoms.  The test was done due to palpitations in certain positions which seems to be atypical.  He is already on low-dose aspirin.  I discussed with him different management options and suggested either CTA of the coronary arteries or proceeding with left heart catheterization.  Given his minimal symptoms and previous normal cardiac cath in 1999, he prefers a noninvasive approach to start with.  Thus, we will go ahead and schedule him for CTA of the coronary arteries with FFR.  His  resting heart rate is 65 bpm and thus will only give him a small dose metoprolol.  2.  Hyperlipidemia: Currently on rosuvastatin.  If CTA confirmed significant coronary artery disease, will need to be more aggressive with this.  3.  Palpitations: EF was normal on recent stress test.  Evaluation with an outpatient monitor can be considered once ischemic work-up is done.    Time:   Today, I have spent 15 minutes with the patient with telehealth technology discussing the above problems.     Medication Adjustments/Labs and Tests Ordered: Current medicines are reviewed at length with the patient today.  Concerns regarding medicines are outlined above.   Tests Ordered: No orders of the defined types were placed in this encounter.   Medication Changes: No  orders of the defined types were placed in this encounter.   Follow Up: Based on results of stress testing.  Signed, Lorine BearsMuhammad Prabhav Faulkenberry, MD  11/22/2018 11:50 AM    [facility]

## 2018-11-22 NOTE — Telephone Encounter (Signed)
Virtual Visit Pre-Appointment Phone Call  "Kyle Mack, I am calling you today to discuss your upcoming appointment. We are currently trying to limit exposure to the virus that causes COVID-19 by seeing patients at home rather than in the office."  1. "What is the BEST phone number to call the day of the visit?" - include this in appointment notes  2. "Do you have or have access to (through a family member/friend) a smartphone with video capability that we can use for your visit?" a. If yes - list this number in appt notes as "cell" (if different from BEST phone #) and list the appointment type as a VIDEO visit in appointment notes b. If no - list the appointment type as a PHONE visit in appointment notes  3. Confirm consent - "In the setting of the current Covid19 crisis, you are scheduled for a video visit with your provider on 11/22/2018 at 12:00pm.  Just as we do with many in-office visits, in order for you to participate in this visit, we must obtain consent.  If you'd like, I can send this to your mychart (if signed up) or email for you to review.  Otherwise, I can obtain your verbal consent now.  All virtual visits are billed to your insurance company just like a normal visit would be.  By agreeing to a virtual visit, we'd like you to understand that the technology does not allow for your provider to perform an examination, and thus may limit your provider's ability to fully assess your condition. If your provider identifies any concerns that need to be evaluated in person, we will make arrangements to do so.  Finally, though the technology is pretty good, we cannot assure that it will always work on either your or our end, and in the setting of a video visit, we may have to convert it to a phone-only visit.  In either situation, we cannot ensure that we have a secure connection.  Are you willing to proceed?" STAFF: Did the patient verbally acknowledge consent to telehealth visit? Document YES/NO  here: YES  4. Advise patient to be prepared - "Two hours prior to your appointment, go ahead and check your blood pressure, pulse, oxygen saturation, and your weight (if you have the equipment to check those) and write them all down. When your visit starts, your provider will ask you for this information. If you have an Apple Watch or Kardia device, please plan to have heart rate information ready on the day of your appointment. Please have a pen and paper handy nearby the day of the visit as well."  5. Give patient instructions for MyChart download to smartphone OR Doximity/Doxy.me as below if video visit (depending on what platform provider is using)  6. Inform patient they will receive a phone call 15 minutes prior to their appointment time (may be from unknown caller ID) so they should be prepared to answer    TELEPHONE CALL NOTE  Kyle Mack has been deemed a candidate for a follow-up tele-health visit to limit community exposure during the Covid-19 pandemic. I spoke with the patient via phone to ensure availability of phone/video source, confirm preferred email & phone number, and discuss instructions and expectations.  I reminded Kyle Mack to be prepared with any vital sign and/or heart rhythm information that could potentially be obtained via home monitoring, at the time of his visit. I reminded Kyle Mack to expect a phone call prior to his visit.   L  [name] 11/22/2018 11:50 AM   INSTRUCTIONS FOR DOWNLOADING THE MYCHART APP TO SMARTPHONE  - The patient must first make sure to have activated MyChart and know their login information - If Apple, go to [employer] Store and type in MyChart in the search bar and download the app. If Android, ask patient to go to [employer] Play Store and type in ColonMyChart in the search bar and download the app. The app is free but as with any other app downloads, their phone may require them to verify saved payment information or Apple/Android  password.  - The patient will need to then log into the app with their MyChart username and password, and select [facility] as their healthcare provider to link the account. When it is time for your visit, go to the MyChart app, find appointments, and click Begin Video Visit. Be sure to Select Allow for your device to access the Microphone and Camera for your visit. You will then be connected, and your provider will be with you shortly.  **If they have any issues connecting, or need assistance please contact MyChart service desk [phone])**  **If using a computer, in order to ensure the best quality for their visit they will need to use either of the following Internet Browsers: [employer] Edge, or Google Chrome**  IF USING DOXIMITY or DOXY.ME - The patient will receive a link just prior to their visit by text.     FULL LENGTH CONSENT FOR TELE-HEALTH VISIT   I hereby voluntarily request, consent and authorize [facility] and its employed or contracted physicians, physician assistants, nurse practitioners or other licensed health care professionals (the Practitioner), to provide me with telemedicine health care services (the "Services") as deemed necessary by the treating Practitioner. I acknowledge and consent to receive the Services by the Practitioner via telemedicine. I understand that the telemedicine visit will involve communicating with the Practitioner through live audiovisual communication technology and the disclosure of certain medical information by electronic transmission. I acknowledge that I have been given the opportunity to request an in-person assessment or other available alternative prior to the telemedicine visit and am voluntarily participating in the telemedicine visit.  I understand that I have the right to withhold or withdraw my consent to the use of telemedicine in the course of my care at any time, without affecting my right to future care or treatment,  and that the Practitioner or I may terminate the telemedicine visit at any time. I understand that I have the right to inspect all information obtained and/or recorded in the course of the telemedicine visit and may receive copies of available information for a reasonable fee.  I understand that some of the potential risks of receiving the Services via telemedicine include:  Marland Kitchen. Delay or interruption in medical evaluation due to technological equipment failure or disruption; . Information transmitted may not be sufficient (e.g. poor resolution of images) to allow for appropriate medical decision making by the Practitioner; and/or  . In rare instances, security protocols could fail, causing a breach of personal health information.  Furthermore, I acknowledge that it is my responsibility to provide information about my medical history, conditions and care that is complete and accurate to the best of my ability. I acknowledge that Practitioner's advice, recommendations, and/or decision may be based on factors not within their control, such as incomplete or inaccurate data provided by me or distortions of diagnostic images or specimens that may result from electronic transmissions. I understand that the practice  of medicine is not an Visual merchandiser and that Practitioner makes no warranties or guarantees regarding treatment outcomes. I acknowledge that I will receive a copy of this consent concurrently upon execution via email to the email address I last provided but may also request a printed copy by calling the office of [facility].    I understand that my insurance will be billed for this visit.   I have read or had this consent read to me. . I understand the contents of this consent, which adequately explains the benefits and risks of the Services being provided via telemedicine.  . I have been provided ample opportunity to ask questions regarding this consent and the Services and have had my questions  answered to my satisfaction. . I give my informed consent for the services to be provided through the use of telemedicine in my medical care  By participating in this telemedicine visit I agree to the above.

## 2018-11-22 NOTE — Patient Instructions (Addendum)
Medication Instructions:  Your physician recommends that you continue on your current medications as directed. Please refer to the Current Medication list given to you today.  A one time dose of Metoprolol Tartrate (25 mg tablet) has been sent to your pharmacy. Please take 2 hours prior to your Cardiac CT  If you need a refill on your cardiac medications before your next appointment, please call your pharmacy.   Lab work: Your physician recommends that you return for lab work prior to your CT. Please have your labs drawn at [place] 1-2 days prior to your CT.   If you have labs (blood work) drawn today and your tests are completely normal, you will receive your results only by: Marland Kitchen MyChart Message (if you have MyChart) OR . A paper copy in the mail If you have any lab test that is abnormal or we need to change your treatment, we will call you to review the results.  Testing/Procedures: Your physician has requested that you have cardiac CT. Cardiac computed tomography (CT) is a painless test that uses an x-ray machine to take clear, detailed pictures of your heart. For further information please visit HugeFiesta.tn. Please follow instruction sheet as given.     Follow-Up: At [facility], you and your health needs are our priority.  As part of our continuing mission to provide you with exceptional heart care, we have created designated Provider Care Teams.  These Care Teams include your primary Cardiologist (physician) and Advanced Practice Providers (APPs -  Physician Assistants and Nurse Practitioners) who all work together to provide you with the care you need, when you need it. You will need a follow up appointment pending test results    You may see Dr. Fletcher Anon  or one of the following Advanced Practice Providers on your designated Care Team:   Murray Hodgkins, NP Christell Faith, PA-C . Marrianne Mood, PA-C  Any Other Special Instructions Will Be Listed Below (If  Applicable). Your cardiac CT will be scheduled at one of the below locations:   [facility] [address] [place], [place] [address] [phone] Kimberly [address] [place], [place] [address] [phone]  If scheduled at [facility], please arrive at the [facility] main entrance of [facility] 30-45 minutes prior to test start time. Proceed to the [facility] Radiology Department (first floor) to check-in and test prep.  If scheduled at [facility], please arrive 15 mins early for check-in and test prep.  Please follow these instructions carefully (unless otherwise directed):  Hold all erectile dysfunction medications at least 3 days (72 hrs) prior to test.  On the Night Before the Test: . Be sure to Drink plenty of water. . Do not consume any caffeinated/decaffeinated beverages or chocolate 12 hours prior to your test. . Do not take any antihistamines 12 hours prior to your test.  On the Day of the Test: . Drink plenty of water. Do not drink any water within one hour of the test. . Do not eat any food 4 hours prior to the test. . You may take your regular medications prior to the test.  . Take metoprolol (Lopressor) two hours prior to test.       After the Test: . Drink plenty of water. . After receiving IV contrast, you may experience a mild flushed feeling. This is normal. . On occasion, you may experience a mild rash  up to 24 hours after the test. This is not dangerous. If this occurs, you can take Benadryl 25 mg and increase your fluid intake. . If you experience trouble breathing, this can be serious. If it is severe call 911 IMMEDIATELY. If it is mild, please call our office. . If you take any of these medications: Glipizide/Metformin, Avandament, Glucavance, please do not take 48 hours after completing test unless otherwise instructed.    Please  contact the cardiac imaging nurse navigator should you have any questions/concerns Rockwell AlexandriaSara Wallace, RN Navigator Cardiac Imaging [facility] [phone] Office  [phone] Cell

## 2018-11-25 ENCOUNTER — Other Ambulatory Visit: Payer: Self-pay

## 2018-11-25 ENCOUNTER — Other Ambulatory Visit (INDEPENDENT_AMBULATORY_CARE_PROVIDER_SITE_OTHER): Payer: Medicare Other | Admitting: *Deleted

## 2018-11-25 DIAGNOSIS — Z01812 Encounter for preprocedural laboratory examination: Secondary | ICD-10-CM

## 2018-11-25 DIAGNOSIS — R079 Chest pain, unspecified: Secondary | ICD-10-CM

## 2018-11-25 NOTE — Addendum Note (Signed)
Addended by: Lamar Laundry on: 11/25/2018 12:51 PM   Modules accepted: Orders

## 2018-11-25 NOTE — Addendum Note (Signed)
Addended by: Lamar Laundry on: 11/25/2018 12:46 PM   Modules accepted: Orders

## 2018-11-25 NOTE — Telephone Encounter (Signed)
Lmom for the patient. Patient will be contacted soon to have his Cardiac CT this week. A BMP will be needed prior to the CT. Lab order is in [place]. The patient is to call back if any questions or concerns.

## 2018-11-26 ENCOUNTER — Telehealth (HOSPITAL_COMMUNITY): Payer: Self-pay | Admitting: Emergency Medicine

## 2018-11-26 LAB — BASIC METABOLIC PANEL
BUN/Creatinine Ratio: 20 (ref 10–24)
BUN: 21 mg/dL (ref 8–27)
CO2: 24 mmol/L (ref 20–29)
Calcium: 10.2 mg/dL (ref 8.6–10.2)
Chloride: 102 mmol/L (ref 96–106)
Creatinine, Ser: 1.04 mg/dL (ref 0.76–1.27)
GFR calc Af Amer: 84 mL/min/{1.73_m2} (ref >59)
GFR calc non Af Amer: 73 mL/min/{1.73_m2} (ref >59)
Glucose: 89 mg/dL (ref 65–99)
Potassium: 4.9 mmol/L (ref 3.5–5.2)
Sodium: 139 mmol/L (ref 134–144)

## 2018-11-26 NOTE — Telephone Encounter (Signed)
Reaching out to patient to offer assistance regarding upcoming cardiac imaging study; pt verbalizes understanding of appt date/time, pt states he did not know he needed to pick up prescription for metoprolol nor did he think he could pick it up today.  Denies further questions ;  name and call back number provided for further questions should they arise Marchia Bond RN Navigator Cardiac Imaging [facility] [phone] office [phone] cell

## 2018-11-27 ENCOUNTER — Other Ambulatory Visit: Payer: Self-pay

## 2018-11-27 ENCOUNTER — Ambulatory Visit
Admission: RE | Admit: 2018-11-27 | Discharge: 2018-11-27 | Disposition: A | Discharge status: Home / Self Care | Payer: Medicare Other | Source: Ambulatory Visit | Attending: Cardiovascular Disease | Admitting: Cardiovascular Disease

## 2018-11-27 DIAGNOSIS — R072 Precordial pain: Secondary | ICD-10-CM | POA: Diagnosis not present

## 2018-11-27 MED ORDER — NITROGLYCERIN 0.4 MG SL SUBL
0.8000 mg | SUBLINGUAL_TABLET | Freq: Once | SUBLINGUAL | Status: AC
  Administered 2018-11-27: 0.8 mg via SUBLINGUAL

## 2018-11-27 MED ORDER — IOHEXOL 350 MG/ML SOLN
100.0000 mL | Freq: Once | INTRAVENOUS | Status: AC | PRN
  Administered 2018-11-27: 75 mL via INTRAVENOUS

## 2018-11-27 NOTE — Progress Notes (Signed)
Pt completed CCTA without incident; pt denies dizziness or lightheadedness; pt refused refreshement after exam; pt ambulatory to lobby with steady gait noted

## 2018-12-13 ENCOUNTER — Telehealth: Payer: Medicare Other | Admitting: Cardiovascular Disease

## 2019-06-18 ENCOUNTER — Other Ambulatory Visit: Payer: Self-pay | Admitting: Unknown Physician Specialty

## 2019-06-18 DIAGNOSIS — H93A2 Pulsatile tinnitus, left ear: Secondary | ICD-10-CM

## 2019-06-23 ENCOUNTER — Ambulatory Visit: Payer: Medicare Other

## 2019-06-25 ENCOUNTER — Ambulatory Visit
Admission: RE | Admit: 2019-06-25 | Discharge: 2019-06-25 | Disposition: A | Discharge status: Home / Self Care | Payer: Medicare Other | Source: Ambulatory Visit | Attending: Unknown Physician Specialty | Admitting: Unknown Physician Specialty

## 2019-06-25 ENCOUNTER — Other Ambulatory Visit: Payer: Self-pay

## 2019-06-25 DIAGNOSIS — H93A2 Pulsatile tinnitus, left ear: Secondary | ICD-10-CM | POA: Diagnosis not present

## 2021-02-02 ENCOUNTER — Emergency Department (HOSPITAL_COMMUNITY): Payer: Medicare Other | Admitting: Anesthesiology

## 2021-02-02 ENCOUNTER — Encounter (HOSPITAL_COMMUNITY): Payer: Self-pay

## 2021-02-02 ENCOUNTER — Ambulatory Visit (HOSPITAL_COMMUNITY)
Admission: EM | Admit: 2021-02-02 | Discharge: 2021-02-02 | Disposition: A | Discharge status: Home / Self Care | Payer: Medicare Other | Attending: Emergency Medicine | Admitting: Emergency Medicine

## 2021-02-02 ENCOUNTER — Emergency Department (HOSPITAL_COMMUNITY): Payer: Medicare Other

## 2021-02-02 ENCOUNTER — Encounter (HOSPITAL_COMMUNITY)
Admission: EM | Disposition: A | Discharge status: Home / Self Care | Payer: Self-pay | Source: Home / Self Care | Attending: Emergency Medicine

## 2021-02-02 ENCOUNTER — Other Ambulatory Visit: Payer: Self-pay

## 2021-02-02 ENCOUNTER — Ambulatory Visit: Admit: 2021-02-02 | Payer: Medicare Other | Admitting: Plastic Surgery

## 2021-02-02 DIAGNOSIS — Z23 Encounter for immunization: Secondary | ICD-10-CM | POA: Insufficient documentation

## 2021-02-02 DIAGNOSIS — S68127A Partial traumatic metacarpophalangeal amputation of left little finger, initial encounter: Secondary | ICD-10-CM | POA: Diagnosis present

## 2021-02-02 DIAGNOSIS — W312XXA Contact with powered woodworking and forming machines, initial encounter: Secondary | ICD-10-CM | POA: Insufficient documentation

## 2021-02-02 DIAGNOSIS — S68627A Partial traumatic transphalangeal amputation of left little finger, initial encounter: Secondary | ICD-10-CM | POA: Insufficient documentation

## 2021-02-02 DIAGNOSIS — K219 Gastro-esophageal reflux disease without esophagitis: Secondary | ICD-10-CM | POA: Insufficient documentation

## 2021-02-02 DIAGNOSIS — Z20822 Contact with and (suspected) exposure to covid-19: Secondary | ICD-10-CM | POA: Diagnosis not present

## 2021-02-02 HISTORY — PX: AMPUTATION: SHX166

## 2021-02-02 LAB — CBC
HCT: 41.9 % (ref 39.0–52.0)
Hemoglobin: 13.8 g/dL (ref 13.0–17.0)
MCH: 29.2 pg (ref 26.0–34.0)
MCHC: 32.9 g/dL (ref 30.0–36.0)
MCV: 88.6 fL (ref 80.0–100.0)
Platelets: 257 10*3/uL (ref 150–400)
RBC: 4.73 MIL/uL (ref 4.22–5.81)
RDW: 13.6 % (ref 11.5–15.5)
WBC: 8 10*3/uL (ref 4.0–10.5)
nRBC: 0 % (ref 0.0–0.2)

## 2021-02-02 LAB — RESP PANEL BY RT-PCR (FLU A&B, COVID) ARPGX2
Influenza A by PCR: NEGATIVE
Influenza B by PCR: NEGATIVE
SARS Coronavirus 2 by RT PCR: NEGATIVE

## 2021-02-02 SURGERY — AMPUTATION DIGIT
Anesthesia: Monitor Anesthesia Care | Site: Finger | Laterality: Left

## 2021-02-02 MED ORDER — TETANUS-DIPHTH-ACELL PERTUSSIS 5-2.5-18.5 LF-MCG/0.5 IM SUSY
0.5000 mL | PREFILLED_SYRINGE | Freq: Once | INTRAMUSCULAR | Status: AC
  Administered 2021-02-02: 0.5 mL via INTRAMUSCULAR
  Filled 2021-02-02: qty 0.5

## 2021-02-02 MED ORDER — MIDAZOLAM HCL 2 MG/2ML IJ SOLN
INTRAMUSCULAR | Status: AC
  Filled 2021-02-02: qty 2

## 2021-02-02 MED ORDER — MIDAZOLAM HCL 5 MG/5ML IJ SOLN
INTRAMUSCULAR | Status: DC | PRN
  Administered 2021-02-02: 2 mg via INTRAVENOUS

## 2021-02-02 MED ORDER — CEFAZOLIN SODIUM-DEXTROSE 2-3 GM-%(50ML) IV SOLR
INTRAVENOUS | Status: DC | PRN
  Administered 2021-02-02: 2 g via INTRAVENOUS

## 2021-02-02 MED ORDER — CHLORHEXIDINE GLUCONATE 4 % EX LIQD: 60.0000 mL | Freq: Once | CUTANEOUS | Status: DC

## 2021-02-02 MED ORDER — CEPHALEXIN 500 MG PO CAPS: 500.0000 mg | ORAL_CAPSULE | Freq: Four times a day (QID) | ORAL | 0 refills | Status: AC

## 2021-02-02 MED ORDER — HYDROCODONE-ACETAMINOPHEN 5-325 MG PO TABS: 1.0000 | ORAL_TABLET | ORAL | 0 refills | Status: DC | PRN

## 2021-02-02 MED ORDER — CEFAZOLIN SODIUM-DEXTROSE 2-4 GM/100ML-% IV SOLN
2.0000 g | INTRAVENOUS | Status: AC
  Administered 2021-02-02: 2 g via INTRAVENOUS
  Filled 2021-02-02: qty 100

## 2021-02-02 MED ORDER — FENTANYL CITRATE PF 50 MCG/ML IJ SOSY
100.0000 ug | PREFILLED_SYRINGE | Freq: Once | INTRAMUSCULAR | Status: AC
  Administered 2021-02-02: 100 ug via INTRAVENOUS
  Filled 2021-02-02: qty 2

## 2021-02-02 MED ORDER — LIDOCAINE-EPINEPHRINE 1 %-1:100000 IJ SOLN
INTRAMUSCULAR | Status: DC | PRN
  Administered 2021-02-02: 20 mL

## 2021-02-02 MED ORDER — CEFAZOLIN SODIUM-DEXTROSE 2-4 GM/100ML-% IV SOLN
2.0000 g | INTRAVENOUS | Status: DC
  Filled 2021-02-02: qty 100

## 2021-02-02 MED ORDER — FENTANYL CITRATE (PF) 250 MCG/5ML IJ SOLN
INTRAMUSCULAR | Status: AC
  Filled 2021-02-02: qty 5

## 2021-02-02 MED ORDER — ONDANSETRON HCL 4 MG/2ML IJ SOLN: 4.0000 mg | Freq: Once | INTRAMUSCULAR | Status: DC

## 2021-02-02 MED ORDER — PHENYLEPHRINE HCL (PRESSORS) 10 MG/ML IV SOLN
INTRAVENOUS | Status: AC
  Filled 2021-02-02: qty 1

## 2021-02-02 MED ORDER — LACTATED RINGERS IV SOLN: INTRAVENOUS | Status: DC | PRN

## 2021-02-02 MED ORDER — ONDANSETRON HCL 4 MG/2ML IJ SOLN
INTRAMUSCULAR | Status: DC | PRN
  Administered 2021-02-02: 4 mg via INTRAVENOUS

## 2021-02-02 MED ORDER — DEXAMETHASONE SODIUM PHOSPHATE 10 MG/ML IJ SOLN
INTRAMUSCULAR | Status: AC
  Filled 2021-02-02: qty 1

## 2021-02-02 MED ORDER — POVIDONE-IODINE 10 % EX SWAB: 2.0000 "application " | Freq: Once | CUTANEOUS | Status: DC

## 2021-02-02 MED ORDER — FENTANYL CITRATE (PF) 250 MCG/5ML IJ SOLN
INTRAMUSCULAR | Status: DC | PRN
  Administered 2021-02-02 (×2): 50 ug via INTRAVENOUS

## 2021-02-02 MED ORDER — MORPHINE SULFATE (PF) 4 MG/ML IV SOLN
4.0000 mg | Freq: Once | INTRAVENOUS | Status: AC
  Administered 2021-02-02: 4 mg via INTRAVENOUS
  Filled 2021-02-02: qty 1

## 2021-02-02 MED ORDER — ONDANSETRON HCL 4 MG/2ML IJ SOLN
INTRAMUSCULAR | Status: AC
  Filled 2021-02-02: qty 2

## 2021-02-02 MED ORDER — PROPOFOL 10 MG/ML IV BOLUS
INTRAVENOUS | Status: DC | PRN
  Administered 2021-02-02: 50 mg via INTRAVENOUS

## 2021-02-02 MED ORDER — PROPOFOL 10 MG/ML IV BOLUS
INTRAVENOUS | Status: AC
  Filled 2021-02-02: qty 20

## 2021-02-02 MED ORDER — SODIUM CHLORIDE 0.9 % IV BOLUS
500.0000 mL | Freq: Once | INTRAVENOUS | Status: AC
  Administered 2021-02-02: 500 mL via INTRAVENOUS

## 2021-02-02 MED ORDER — LIDOCAINE-EPINEPHRINE 1 %-1:100000 IJ SOLN
INTRAMUSCULAR | Status: AC
  Filled 2021-02-02: qty 1

## 2021-02-02 MED ORDER — PROPOFOL 500 MG/50ML IV EMUL
INTRAVENOUS | Status: DC | PRN
  Administered 2021-02-02: 100 ug/kg/min via INTRAVENOUS

## 2021-02-02 SURGICAL SUPPLY — 45 items
BAG COUNTER SPONGE SURGICOUNT (BAG) IMPLANT
BNDG COHESIVE 1.5X5 TAN NS LF (GAUZE/BANDAGES/DRESSINGS) IMPLANT
BNDG CONFORM 2 STRL LF (GAUZE/BANDAGES/DRESSINGS) IMPLANT
BNDG ELASTIC 2X5.8 VLCR STR LF (GAUZE/BANDAGES/DRESSINGS) IMPLANT
BNDG ELASTIC 3X5.8 VLCR STR LF (GAUZE/BANDAGES/DRESSINGS) IMPLANT
BNDG ELASTIC 4X5.8 VLCR STR LF (GAUZE/BANDAGES/DRESSINGS) ×2 IMPLANT
BNDG ESMARK 4X9 LF (GAUZE/BANDAGES/DRESSINGS) ×2 IMPLANT
BNDG GAUZE ELAST 4 BULKY (GAUZE/BANDAGES/DRESSINGS) ×2 IMPLANT
CNTNR URN SCR LID CUP LEK RST (MISCELLANEOUS) IMPLANT
CONT SPEC 4OZ STRL OR WHT (MISCELLANEOUS)
CORD BIPOLAR FORCEPS 12FT (ELECTRODE) ×2 IMPLANT
COVER SURGICAL LIGHT HANDLE (MISCELLANEOUS) ×2 IMPLANT
CUFF TOURN SGL QUICK 18X4 (TOURNIQUET CUFF) IMPLANT
CUFF TOURN SGL QUICK 24 (TOURNIQUET CUFF)
CUFF TRNQT CYL 24X4X16.5-23 (TOURNIQUET CUFF) IMPLANT
DRAPE SURG 17X23 STRL (DRAPES) ×2 IMPLANT
DRSG ADAPTIC 3X8 NADH LF (GAUZE/BANDAGES/DRESSINGS) ×2 IMPLANT
GAUZE SPONGE 2X2 8PLY STRL LF (GAUZE/BANDAGES/DRESSINGS) IMPLANT
GAUZE SPONGE 4X4 12PLY STRL (GAUZE/BANDAGES/DRESSINGS) ×4 IMPLANT
GLOVE SURG ORTHO LTX SZ8 (GLOVE) ×2 IMPLANT
GLOVE SURG UNDER POLY LF SZ8.5 (GLOVE) ×2 IMPLANT
GOWN STRL REUS W/ TWL LRG LVL3 (GOWN DISPOSABLE) ×2 IMPLANT
GOWN STRL REUS W/ TWL XL LVL3 (GOWN DISPOSABLE) ×1 IMPLANT
GOWN STRL REUS W/TWL LRG LVL3 (GOWN DISPOSABLE) ×4
GOWN STRL REUS W/TWL XL LVL3 (GOWN DISPOSABLE) ×2
KIT BASIN OR (CUSTOM PROCEDURE TRAY) ×2 IMPLANT
MANIFOLD NEPTUNE II (INSTRUMENTS) ×2 IMPLANT
NEEDLE HYPO 25X1 1.5 SAFETY (NEEDLE) ×2 IMPLANT
NS IRRIG 1000ML POUR BTL (IV SOLUTION) ×2 IMPLANT
PACK ORTHO EXTREMITY (CUSTOM PROCEDURE TRAY) ×2 IMPLANT
PAD ARMBOARD 7.5X6 YLW CONV (MISCELLANEOUS) IMPLANT
PAD CAST 4YDX4 CTTN HI CHSV (CAST SUPPLIES) IMPLANT
PADDING CAST COTTON 4X4 STRL (CAST SUPPLIES)
SOAP 2 % CHG 4 OZ (WOUND CARE) IMPLANT
SOL PREP PROV IODINE SCRUB 4OZ (MISCELLANEOUS) ×2 IMPLANT
SPONGE GAUZE 2X2 STER 10/PKG (GAUZE/BANDAGES/DRESSINGS)
SUCTION FRAZIER HANDLE 10FR (MISCELLANEOUS)
SUCTION TUBE FRAZIER 10FR DISP (MISCELLANEOUS) IMPLANT
SUT MERSILENE 4 0 P 3 (SUTURE) IMPLANT
SUT PROLENE 4 0 PS 2 18 (SUTURE) IMPLANT
SYR CONTROL 10ML LL (SYRINGE) IMPLANT
TOWEL OR 17X26 10 PK STRL BLUE (TOWEL DISPOSABLE) ×2 IMPLANT
TOWEL OR NON WOVEN STRL DISP B (DISPOSABLE) ×2 IMPLANT
TUBING CONNECTING 10 (TUBING) ×2 IMPLANT
WATER STERILE IRR 1000ML POUR (IV SOLUTION) IMPLANT

## 2021-02-02 NOTE — ED Triage Notes (Signed)
Pt reports cutting his left little finger with a power saw. His finger appears to be hanging on by skin alone and has lost most of its color.

## 2021-02-02 NOTE — Discharge Instructions (Signed)
Activity: As tolerated, but avoid strenuous activity until follow up visit.  Diet: Regular  Wound Care: Keep dressing clean & dry for 2 days.  After that you can shower normally.  Redress the wound as needed for comfort.  Special Instructions:  Call our office if any unusual problems occur such as pain, excessive bleeding, unrelieved nausea/vomiting, fever &/or chills.  Follow-up appointment: Scheduled for next week.  

## 2021-02-02 NOTE — Anesthesia Preprocedure Evaluation (Signed)
Anesthesia Evaluation  Patient identified by MRN, date of birth, ID band Patient awake    Reviewed: Allergy & Precautions, H&P , NPO status , Patient's Chart, lab work & pertinent test results  Airway Mallampati: II  TM Distance: >3 FB Neck ROM: Full    Dental no notable dental hx.    Pulmonary neg pulmonary ROS,    Pulmonary exam normal breath sounds clear to auscultation       Cardiovascular negative cardio ROS Normal cardiovascular exam Rhythm:Regular Rate:Normal     Neuro/Psych negative neurological ROS  negative psych ROS   GI/Hepatic Neg liver ROS, GERD  Medicated,  Endo/Other  negative endocrine ROS  Renal/GU negative Renal ROS  negative genitourinary   Musculoskeletal negative musculoskeletal ROS (+)   Abdominal   Peds negative pediatric ROS (+)  Hematology negative hematology ROS (+)   Anesthesia Other Findings   Reproductive/Obstetrics negative OB ROS                             Anesthesia Physical Anesthesia Plan  ASA: 2  Anesthesia Plan: MAC   Post-op Pain Management: Regional block   Induction: Intravenous  PONV Risk Score and Plan: 1 and Propofol infusion and Treatment may vary due to age or medical condition  Airway Management Planned: Simple Face Mask  Additional Equipment:   Intra-op Plan:   Post-operative Plan:   Informed Consent: I have reviewed the patients History and Physical, chart, labs and discussed the procedure including the risks, benefits and alternatives for the proposed anesthesia with the patient or authorized representative who has indicated his/her understanding and acceptance.     Dental advisory given  Plan Discussed with: CRNA and Surgeon  Anesthesia Plan Comments: (Digital block with MAC)        Anesthesia Quick Evaluation

## 2021-02-02 NOTE — Consult Note (Signed)
Reason for Consult:Left little finger amputation Referring Physician: Linwood Dibbles Time called: 1019 Time at bedside: 1048   Kyle Mack is an 70 y.o. male.  HPI: Kyle Mack was working with a circular saw and suffered an injury to his left little finger. He came to the ED and hand surgery was consulted. He is insensate distal to the injury.  Past Medical History:  Diagnosis Date   Cervical spine pain    GERD (gastroesophageal reflux disease)    Osteopenia     Past Surgical History:  Procedure Laterality Date   ANKLE SURGERY Right    FINGER SURGERY Right 4th digit   ROTATOR CUFF REPAIR Left     Family History  Problem Relation Age of Onset   COPD Mother    Heart disease Father     Social History:  reports that he has never smoked. He does not have any smokeless tobacco history on file. He reports that he does not drink alcohol and does not use drugs.  Allergies:  Allergies  Allergen Reactions   Alendronate    Biphosphate Other (See Comments)    Last over a week   Nsaids Nausea And Vomiting    Medications: I have reviewed the patient's current medications.  No results found for this or any previous visit (from the past 48 hour(s)).  DG Hand Complete Left  Result Date: 02/02/2021 CLINICAL DATA:  70 year old male status post power saw injury, near traumatic amputation of the 5th finger. EXAM: LEFT HAND - COMPLETE 3+ VIEW COMPARISON:  None. FINDINGS: Comminuted, irregular midshaft fracture of the 5th proximal phalanx with scattered small comminution fragments and severe associated soft tissue injury. Fifth MCP and PIP joints appear maintained. Mostly dorsal displaced distal fragment, with mild radial angulation. No other No acute osseous abnormality identified. Chronic ulnar styloid fracture. First CMC joint osteoarthritis. 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 Electronically Signed   By: Odessa Fleming M.D.   On: 02/02/2021  10:42    Review of Systems  HENT:  Negative for ear discharge, ear pain, hearing loss and tinnitus.   Eyes:  Negative for photophobia and pain.  Respiratory:  Negative for cough and shortness of breath.   Cardiovascular:  Negative for chest pain.  Gastrointestinal:  Negative for abdominal pain, nausea and vomiting.  Genitourinary:  Negative for dysuria, flank pain, frequency and urgency.  Musculoskeletal:  Positive for arthralgias (Left little finger). Negative for back pain, myalgias and neck pain.  Neurological:  Negative for dizziness and headaches.  Hematological:  Does not bruise/bleed easily.  Psychiatric/Behavioral:  The patient is not nervous/anxious.   Blood pressure 140/81, pulse 70, temperature 98 F (36.7 C), temperature source Oral, resp. rate 18, SpO2 99 %. Physical Exam Constitutional:      General: He is not in acute distress.    Appearance: He is well-developed. He is not diaphoretic.  HENT:     Head: Normocephalic and atraumatic.  Eyes:     General: No scleral icterus.       Right eye: No discharge.        Left eye: No discharge.     Conjunctiva/sclera: Conjunctivae normal.  Cardiovascular:     Rate and Rhythm: Normal rate and regular rhythm.  Pulmonary:     Effort: Pulmonary effort is normal. No respiratory distress.  Musculoskeletal:     Cervical back: Normal range of motion.     Comments: Left shoulder, elbow, wrist, digits- Near amputation little  finger distal to MCP, insensate distally, no cap refill, no motion, nontender, no instability, no blocks to motion  Sens  Ax/R/M/U intact  Mot   Ax/ R/ PIN/ M/ AIN/ U intact  Rad 2+  Skin:    General: Skin is warm and dry.  Neurological:     Mental Status: He is alert.  Psychiatric:        Mood and Affect: Mood normal.        Behavior: Behavior normal.    Assessment/Plan: Left little finger amputation -- Plan for completion amputation this afternoon by Dr. Arita Miss. Please keep NPO. Anticipate discharge after  surgery.    Freeman Caldron, PA-C Orthopedic Surgery [phone] 02/02/2021, 10:55 AM

## 2021-02-02 NOTE — Transfer of Care (Signed)
Immediate Anesthesia Transfer of Care Note  Patient: Kyle Mack  Procedure(s) Performed: AMPUTATION DIGIT (Left: Finger)  Patient Location: PACU  Anesthesia Type:MAC  Level of Consciousness: awake and alert   Airway & Oxygen Therapy: Patient Spontanous Breathing and Patient connected to face mask oxygen  Post-op Assessment: Report given to RN and Post -op Vital signs reviewed and stable  Post vital signs: Reviewed and stable  Last Vitals:  Vitals Value Taken Time  BP 161/144 02/02/21 2023  Temp 36.4 C 02/02/21 1948  Pulse 59 02/02/21 2026  Resp 17 02/02/21 2012  SpO2 96 % 02/02/21 2026  Vitals shown include unvalidated device data.  Last Pain:  Vitals:   02/02/21 2000  TempSrc:   PainSc: 0-No pain      Patients Stated Pain Goal: 5 (77/41/28 7867)  Complications: No notable events documented.

## 2021-02-02 NOTE — ED Provider Notes (Signed)
[facility]-EMERGENCY DEPT Provider Note   CSN: [id number] Arrival date & time: 02/02/21  1011     History Chief Complaint  Patient presents with   Hand Injury    Kyle Mack is a 70 y.o. male.  70 year old male presents with partial amputation of the left (non dominant) 5th finger at the MCP which occurred at 10am today just prior to arrival. NPO since 8am, not anticoagulated. Patient was using a saw when he accidentally cut his hand.  No other injuries.  Mild bleeding.  Unable to move the finger or feel the finger.      Past Medical History:  Diagnosis Date   Cervical spine pain    GERD (gastroesophageal reflux disease)    Osteopenia     Patient Active Problem List   Diagnosis Date Noted   Overweight (BMI 25.0-29.9) 07/10/2016   Chronic pain syndrome 07/03/2016   Cervical foraminal stenosis (Right: C3-4) (Bilateral, R>L: C5-6) (Left: C6-7) 02/18/2015   Cervical facet hypertrophy (Right C2-3) (Bilateral, L>R: C6-7) (Bilateral, R>L: C7-T1) 02/18/2015   Neurogenic pain 02/18/2015   Neuropathic pain 02/18/2015   Abnormal MRI, cervical spine (02/17/2015) 02/18/2015   Cervical spondylosis with radiculopathy (sensory) (Left) 02/08/2015   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) 02/08/2015   Chronic neck pain (Location of Primary Source of Pain) (Bilateral) (L>R) 02/08/2015   Chronic upper extremity pain (Left) (shoulder area) 02/08/2015    Past Surgical History:  Procedure Laterality Date   ANKLE SURGERY Right    FINGER SURGERY Right 4th digit   ROTATOR CUFF REPAIR Left        Family History  Problem Relation Age of Onset   COPD Mother    Heart disease Father     Social History   Tobacco Use   Smoking status: Never  Substance Use Topics   Alcohol use: No    Alcohol/week: 0.0 standard drinks   Drug use: No    Home Medications Prior to Admission medications   Medication Sig Start Date End Date Taking? Authorizing  Provider  aspirin EC 81 MG tablet Take 81 mg by mouth daily.    [provider]  Calcium Carbonate-Vitamin D (CALCIUM-VITAMIN D3) 600-125 MG-UNIT TABS Take by mouth daily.     [provider]  desonide (DESOWEN) 0.05 % ointment Apply 1 application topically 2 (two) times daily.    [provider]  dutasteride (AVODART) 0.5 MG capsule Take 0.5 mg by mouth daily.    [provider]  gabapentin (NEURONTIN) 300 MG capsule Take 1 capsule (300 mg total) by mouth 4 (four) times daily. Patient taking differently: Take 300 mg by mouth 4 (four) times daily as needed.  07/25/16 11/22/18  Barbette Merino, NP  metoprolol tartrate (LOPRESSOR) 25 MG tablet Take 1 tablet by mouth 2 hours prior to your Cardiac CT 11/22/18   Iran Ouch, MD  mirabegron ER (MYRBETRIQ) 25 MG TB24 tablet Take 25 mg by mouth daily.    [provider]  rosuvastatin (CRESTOR) 10 MG tablet Take 10 mg by mouth 3 (three) times a week.    [provider]  tamsulosin (FLOMAX) 0.4 MG CAPS capsule Take 0.4 mg by mouth daily.    [provider]  Vitamin D, Ergocalciferol, (DRISDOL) 50000 UNITS CAPS capsule Take 50,000 Units by mouth every 7 (seven) days.    [provider]    Allergies    Alendronate, Biphosphate, and Nsaids  Review of Systems   Review  of Systems  Constitutional:  Negative for fever.  Musculoskeletal:  Positive for arthralgias and myalgias.  Skin:  Positive for wound.  Allergic/Immunologic: Negative for immunocompromised state.  Neurological:  Positive for weakness and numbness.  Hematological:  Does not bruise/bleed easily.  All other systems reviewed and are negative.  Physical Exam Updated Vital Signs BP 110/79   Pulse 65   Temp 98 F (36.7 C) (Oral)   Resp 18   Ht 5\' 9"  (1.753 m)   Wt 81.6 kg   SpO2 98%   BMI 26.58 kg/m   Physical Exam Vitals and nursing note reviewed.  Constitutional:      General: He is not in acute  distress.    Appearance: He is well-developed. He is not diaphoretic.  HENT:     Head: Normocephalic and atraumatic.  Cardiovascular:     Pulses: Normal pulses.  Pulmonary:     Effort: Pulmonary effort is normal.  Musculoskeletal:        General: Tenderness, deformity and signs of injury present.     Comments: Appears to have partial amputation of the left fifth digit at the MCP.  Reports loss of sensation to the entire finger, unable to move the digit.  Digit is pale distally.  Skin:    General: Skin is dry.     Coloration: Skin is pale.  Neurological:     Mental Status: He is alert and oriented to person, place, and time.     Sensory: Sensory deficit present.     Motor: Weakness present.  Psychiatric:        Behavior: Behavior normal.          ED Results / Procedures / Treatments   Labs (all labs ordered are listed, but only abnormal results are displayed) Labs Reviewed  RESP PANEL BY RT-PCR (FLU A&B, COVID) ARPGX2    EKG None  Radiology DG Hand Complete Left  Result Date: 02/02/2021 CLINICAL DATA:  70 year old male status post power saw injury, near traumatic amputation of the 5th finger. EXAM: LEFT HAND - COMPLETE 3+ VIEW COMPARISON:  None. FINDINGS: Comminuted, irregular midshaft fracture of the 5th proximal phalanx with scattered small comminution fragments and severe associated soft tissue injury. Fifth MCP and PIP joints appear maintained. Mostly dorsal displaced distal fragment, with mild radial angulation. No other No acute osseous abnormality identified. Chronic ulnar styloid fracture. First CMC joint osteoarthritis. 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 Electronically Signed   By: 62 M.D.   On: 02/02/2021 10:42    Procedures Procedures   Medications Ordered in ED Medications  chlorhexidine (HIBICLENS) 4 % liquid 4 application (0 application Topical Hold 02/02/21 1330)  povidone-iodine 10 %  swab 2 application (0 application Topical Hold 02/02/21 1330)  ceFAZolin (ANCEF) IVPB 2g/100 mL premix (has no administration in time range)  ondansetron (ZOFRAN) injection 4 mg (0 mg Intravenous Hold 02/02/21 1446)  ceFAZolin (ANCEF) IVPB 2g/100 mL premix (0 g Intravenous Stopped 02/02/21 1125)  Tdap (BOOSTRIX) injection 0.5 mL (0.5 mLs Intramuscular Given 02/02/21 1041)  sodium chloride 0.9 % bolus 500 mL (0 mLs Intravenous Stopped 02/02/21 1330)  morphine 4 MG/ML injection 4 mg (4 mg Intravenous Given 02/02/21 1444)    ED Course  I have reviewed the triage vital signs and the nursing notes.  Pertinent labs & imaging results that were available during my care of the patient were reviewed by me and considered in my medical decision making (see  chart for details).  Clinical Course as of 02/02/21 1505  Wed Feb 02, 2021  6536 70 year old male with left hand injury as above.  Found to have concern for partial amputation left fifth finger at the MCP with loss of sensation, no movement and pale digit from the PIP distally. Case discussed with Earney Hamburg, PA-C, on-call with Ortho with hand who will discuss with Dr. Arita Miss regarding treatment plan. Patient is given Ancef for open fracture, tetanus updated.  He is n.p.o. since 8 AM.  COVID test ordered. [LM]  1046 Patient has requested not to receive narcotic pain medications at this time, will notify staff if this changes. [LM]  1220 Patient seen by orthopedics with plan for OR later today. 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

## 2021-02-02 NOTE — Interval H&P Note (Signed)
History and Physical Interval Note:  02/02/2021 5:24 PM  Kyle Mack  has presented today for surgery, with the diagnosis of PARTIAL AMPUTATION LEFT LITTLE FINGER.  The various methods of treatment have been discussed with the patient and family. After consideration of risks, benefits and other options for treatment, the patient has consented to  Procedure(s): AMPUTATION DIGIT (Left) as a surgical intervention.  The patient's history has been reviewed, patient examined, no change in status, stable for surgery.  I have reviewed the patient's chart and labs.  Questions were answered to the patient's satisfaction.     Allena Napoleon

## 2021-02-02 NOTE — Anesthesia Postprocedure Evaluation (Signed)
Anesthesia Post Note  Patient: Marlowe Alt  Procedure(s) Performed: AMPUTATION DIGIT (Left: Finger)     Patient location during evaluation: PACU Anesthesia Type: MAC Level of consciousness: awake and alert Pain management: pain level controlled Vital Signs Assessment: post-procedure vital signs reviewed and stable Respiratory status: spontaneous breathing, nonlabored ventilation, respiratory function stable and patient connected to nasal cannula oxygen Cardiovascular status: stable and blood pressure returned to baseline Postop Assessment: no apparent nausea or vomiting Anesthetic complications: no   No notable events documented.  Last Vitals:  Vitals:   02/02/21 2030 02/02/21 2040  BP: 113/71 113/72  Pulse: 62 60  Resp: 20 18  Temp:  36.6 C  SpO2: 99% 99%    Last Pain:  Vitals:   02/02/21 2000  TempSrc:   PainSc: 0-No pain                 Marja Adderley S

## 2021-02-02 NOTE — ED Notes (Signed)
In & Out catheter performed by paramedic and RN successfully.

## 2021-02-02 NOTE — Op Note (Signed)
Operative Note   DATE OF OPERATION: 02/02/2021  SURGICAL DEPARTMENT: Plastic Surgery  PREOPERATIVE DIAGNOSES: Left small finger amputation  POSTOPERATIVE DIAGNOSES:  same  PROCEDURE: Revision amputation left small finger  SURGEON: Ancil Linsey, MD  ASSISTANT: None  ANESTHESIA:  General.   COMPLICATIONS: None.   INDICATIONS FOR PROCEDURE:  The patient, Kyle Mack is a 70 y.o. male born on 10/11/1950, is here for treatment of left small finger potation MRN: [id number]  CONSENT:  Informed consent was obtained directly from the patient. Risks, benefits and alternatives were fully discussed. Specific risks including but not limited to bleeding, infection, hematoma, seroma, scarring, pain, contracture, asymmetry, wound healing problems, and need for further surgery were all discussed. The patient did have an ample opportunity to have questions answered to satisfaction.  Did have a long conversation with the patient in the preoperative area.  He had clearly devascularized small finger.  The surrounding tissue was jagged and look to be a bit traumatized by the saw.  We went over the risks and benefits of amputation versus an attempted finger salvage.  Both the patient and I were in agreement that an attempted finger salvage would likely not be successful and even if successful would not likely yield a very functional finger in terms of range of motion.  The patient prefers to proceed with direct amputation today.  DESCRIPTION OF PROCEDURE:  The patient was taken to the operating room. SCDs were placed and antibiotics were given.  Local anesthesia was administered with sedation.  The patient's operative site was prepped and draped in a sterile fashion. A time out was performed and all information was confirmed to be correct.  An Esmarch was used for a forearm tourniquet.  Small finger remained attached simply by a dorsal bridge of skin.  This was cut with a 15 blade to free up the  majority of the amputated portion.  The remnant of the proximal phalanx was identified and this was freed up with a freer elevator back to the MP joint and I came across the joint capsule with a 15 blade to excise en bloc the remaining portion of the proximal phalanx.  All jagged and devitalized skin was excised with 15 blade or tenotomy scissors.  There were a few areas of particulate residue in the wound and this was all irrigated out.  I then irrigated the wound with over a liter of saline.  The forearm tourniquet was then let down.  All bleeding was controlled.  The skin was then tailored to give an appropriate contour and closed with combination of mattress and interrupted 4-0 chromic sutures.  The amputated finger was sent for gross analysis and pathology.  Soft bandage was applied.  The patient tolerated the procedure well.  There were no complications. The patient was allowed to wake from anesthesia, extubated and taken to the recovery room in satisfactory condition.

## 2021-02-02 NOTE — Anesthesia Procedure Notes (Signed)
Procedure Name: MAC Date/Time: 02/02/2021 6:49 PM Performed by: Cynda Familia, CRNA Pre-anesthesia Checklist: Patient identified, Emergency Drugs available, Suction available, Patient being monitored and Timeout performed Patient Re-evaluated:Patient Re-evaluated prior to induction Oxygen Delivery Method: Simple face mask Placement Confirmation: positive ETCO2 and breath sounds checked- equal and bilateral Dental Injury: Teeth and Oropharynx as per pre-operative assessment

## 2021-02-02 NOTE — H&P (View-Only) (Signed)
Reason for Consult:Left little finger amputation Referring Physician: Linwood Dibbles Time called: 1019 Time at bedside: 1048   Kyle Mack is an 70 y.o. male.  HPI: Kyle Mack was working with a circular saw and suffered an injury to his left little finger. He came to the ED and hand surgery was consulted. He is insensate distal to the injury.  Past Medical History:  Diagnosis Date   Cervical spine pain    GERD (gastroesophageal reflux disease)    Osteopenia     Past Surgical History:  Procedure Laterality Date   ANKLE SURGERY Right    FINGER SURGERY Right 4th digit   ROTATOR CUFF REPAIR Left     Family History  Problem Relation Age of Onset   COPD Mother    Heart disease Father     Social History:  reports that he has never smoked. He does not have any smokeless tobacco history on file. He reports that he does not drink alcohol and does not use drugs.  Allergies:  Allergies  Allergen Reactions   Alendronate    Biphosphate Other (See Comments)    Last over a week   Nsaids Nausea And Vomiting    Medications: I have reviewed the patient's current medications.  No results found for this or any previous visit (from the past 48 hour(s)).  DG Hand Complete Left  Result Date: 02/02/2021 CLINICAL DATA:  70 year old male status post power saw injury, near traumatic amputation of the 5th finger. EXAM: LEFT HAND - COMPLETE 3+ VIEW COMPARISON:  None. FINDINGS: Comminuted, irregular midshaft fracture of the 5th proximal phalanx with scattered small comminution fragments and severe associated soft tissue injury. Fifth MCP and PIP joints appear maintained. Mostly dorsal displaced distal fragment, with mild radial angulation. No other No acute osseous abnormality identified. Chronic ulnar styloid fracture. First CMC joint osteoarthritis. 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 Electronically Signed   By: Odessa Fleming M.D.   On: 02/02/2021  10:42    Review of Systems  HENT:  Negative for ear discharge, ear pain, hearing loss and tinnitus.   Eyes:  Negative for photophobia and pain.  Respiratory:  Negative for cough and shortness of breath.   Cardiovascular:  Negative for chest pain.  Gastrointestinal:  Negative for abdominal pain, nausea and vomiting.  Genitourinary:  Negative for dysuria, flank pain, frequency and urgency.  Musculoskeletal:  Positive for arthralgias (Left little finger). Negative for back pain, myalgias and neck pain.  Neurological:  Negative for dizziness and headaches.  Hematological:  Does not bruise/bleed easily.  Psychiatric/Behavioral:  The patient is not nervous/anxious.   Blood pressure 140/81, pulse 70, temperature 98 F (36.7 C), temperature source Oral, resp. rate 18, SpO2 99 %. Physical Exam Constitutional:      General: He is not in acute distress.    Appearance: He is well-developed. He is not diaphoretic.  HENT:     Head: Normocephalic and atraumatic.  Eyes:     General: No scleral icterus.       Right eye: No discharge.        Left eye: No discharge.     Conjunctiva/sclera: Conjunctivae normal.  Cardiovascular:     Rate and Rhythm: Normal rate and regular rhythm.  Pulmonary:     Effort: Pulmonary effort is normal. No respiratory distress.  Musculoskeletal:     Cervical back: Normal range of motion.     Comments: Left shoulder, elbow, wrist, digits- Near amputation little  finger distal to MCP, insensate distally, no cap refill, no motion, nontender, no instability, no blocks to motion  Sens  Ax/R/M/U intact  Mot   Ax/ R/ PIN/ M/ AIN/ U intact  Rad 2+  Skin:    General: Skin is warm and dry.  Neurological:     Mental Status: He is alert.  Psychiatric:        Mood and Affect: Mood normal.        Behavior: Behavior normal.    Assessment/Plan: Left little finger amputation -- Plan for completion amputation this afternoon by Dr. Arita Miss. Please keep NPO. Anticipate discharge after  surgery.    Freeman Caldron, PA-C Orthopedic Surgery [phone] 02/02/2021, 10:55 AM

## 2021-02-03 ENCOUNTER — Encounter (HOSPITAL_COMMUNITY): Payer: Self-pay | Admitting: Plastic Surgery

## 2021-02-07 LAB — SURGICAL PATHOLOGY

## 2021-02-09 ENCOUNTER — Ambulatory Visit (INDEPENDENT_AMBULATORY_CARE_PROVIDER_SITE_OTHER): Payer: Medicare Other | Admitting: Plastic Surgery

## 2021-02-09 ENCOUNTER — Other Ambulatory Visit: Payer: Self-pay

## 2021-02-09 DIAGNOSIS — S6992XA Unspecified injury of left wrist, hand and finger(s), initial encounter: Secondary | ICD-10-CM

## 2021-02-09 NOTE — Progress Notes (Signed)
Patient presents postop from amputation of the left small finger.  He feels like things are coming along fine.  He reports normal sensation and range of motion in his other fingers.  On exam his incision is healing fine.  He does report some phantom sensations in the amputated finger.  No signs of infection.  He is leaving on a road trip tomorrow.  We will plan to see him again in about a month.  We discussed wound care and activity restrictions.  All of his questions were answered.

## 2021-03-09 ENCOUNTER — Other Ambulatory Visit: Payer: Self-pay

## 2021-03-09 ENCOUNTER — Ambulatory Visit (INDEPENDENT_AMBULATORY_CARE_PROVIDER_SITE_OTHER): Payer: Medicare Other | Admitting: Plastic Surgery

## 2021-03-09 DIAGNOSIS — S6992XA Unspecified injury of left wrist, hand and finger(s), initial encounter: Secondary | ICD-10-CM

## 2021-03-09 NOTE — Progress Notes (Signed)
Patient presents postop from left small finger amputation.  He is a little over a month out.  Overall he feels reasonably good about his recovery.  He has some sensitivity along the scar and reports some phantom sensations.  On exam the incision is well-healed and looks appropriate.  There is some sensitivity along the scar on exam.  His other fingers look to be moving normally and have normal sensation.  I would describe his phantom sensations as normal for this point in the process.  I would expect these sensations to improve with time.  He takes gabapentin for his neck and feels that this helps a little bit with his hand as well.  I did offer physical therapy but he prefers to see how things go for now.  We will plan to see him again on an as-needed basis.  All of his questions were answered.

## 2021-06-02 ENCOUNTER — Other Ambulatory Visit: Payer: Self-pay | Admitting: Urology

## 2021-06-02 DIAGNOSIS — N310 Uninhibited neuropathic bladder, not elsewhere classified: Secondary | ICD-10-CM

## 2021-06-02 DIAGNOSIS — M5489 Other dorsalgia: Secondary | ICD-10-CM

## 2021-06-10 ENCOUNTER — Ambulatory Visit (HOSPITAL_COMMUNITY): Payer: Medicare Other

## 2021-06-24 ENCOUNTER — Ambulatory Visit
Admission: RE | Admit: 2021-06-24 | Discharge: 2021-06-24 | Disposition: A | Discharge status: Home / Self Care | Payer: Medicare Other | Source: Ambulatory Visit | Attending: Urology | Admitting: Urology

## 2021-06-24 DIAGNOSIS — N310 Uninhibited neuropathic bladder, not elsewhere classified: Secondary | ICD-10-CM

## 2021-06-24 DIAGNOSIS — M5489 Other dorsalgia: Secondary | ICD-10-CM

## 2021-06-24 MED ORDER — GADOBENATE DIMEGLUMINE 529 MG/ML IV SOLN
18.0000 mL | Freq: Once | INTRAVENOUS | Status: AC | PRN
  Administered 2021-06-24: 18 mL via INTRAVENOUS

## 2022-05-03 ENCOUNTER — Other Ambulatory Visit: Payer: Self-pay

## 2022-05-03 DIAGNOSIS — I200 Unstable angina: Secondary | ICD-10-CM

## 2022-05-10 ENCOUNTER — Ambulatory Visit: Payer: BLUE CROSS/BLUE SHIELD

## 2022-05-10 DIAGNOSIS — I200 Unstable angina: Secondary | ICD-10-CM | POA: Diagnosis not present

## 2022-05-10 MED ORDER — TECHNETIUM TC 99M SESTAMIBI GENERIC - CARDIOLITE
10.3000 | Freq: Once | INTRAVENOUS | Status: AC | PRN
  Administered 2022-05-10: 10.3 via INTRAVENOUS

## 2022-05-10 MED ORDER — TECHNETIUM TC 99M SESTAMIBI GENERIC - CARDIOLITE
34.3000 | Freq: Once | INTRAVENOUS | Status: AC | PRN
  Administered 2022-05-10: 34.3 via INTRAVENOUS

## 2022-06-01 NOTE — Progress Notes (Signed)
Cardiology Office Note   Date:  06/02/2022   ID:  Kyle Mack, DOB 05-Aug-1950, MRN [id number]  PCP:  Kyle Carls, MD  Cardiologist:   Kyle Sacramento, MD   Chief Complaint  Patient presents with   Other    ABN stress test no complaints today. Meds reviewed verbally with pt.      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  He was seen by me in 2020.  He is referred by Dr. Rosario Mack for evaluation of abnormal nuclear stress test. He had previous cardiac catheterization in 1999 in [place] and it was normal. He has known history of hyperlipidemia and strong family history of premature coronary artery disease.  His father died at the age of 8 of myocardial infarction and his brother had myocardial infarction in his late 9s. He is a lifelong non-smoker and has been on statin therapy for many years. He was seen by me in 2020 for palpitations.  He had a nuclear stress test and was able to exercise for 6 minutes.  Nuclear imaging showed evidence of inferior and inferoseptal perfusion defects. He underwent cardiac CTA at that time which showed calcified plaque in the proximal LAD causing less than 50% stenosis and mixed plaque in the left circumflex. He had a recent treadmill nuclear stress test with Dr. Rosario Mack that was mildly abnormal similar to what she had in 2020.  He reports no change in symptoms.  No chest pain, shortness of breath or palpitations.  He walks regularly for exercise and plays golf.   Past Medical History:  Diagnosis Date   Cervical spine pain    GERD (gastroesophageal reflux disease)    Hyperlipidemia    Osteopenia     Past Surgical History:  Procedure Laterality Date   AMPUTATION Left 02/02/2021   Procedure: AMPUTATION DIGIT;  Surgeon: Kyle Presume, MD;  Location: [facility];  Service: Plastics;  Laterality: Left;   ANKLE SURGERY Right    CARDIAC CATHETERIZATION     1999 no stents   FINGER SURGERY Right 4th digit    ROTATOR CUFF REPAIR Left      Current Outpatient Medications  Medication Sig Dispense Refill   aspirin EC 81 MG tablet Take 81 mg by mouth daily.     Calcium Carbonate-Vitamin D (CALCIUM-VITAMIN D3) 600-125 MG-UNIT TABS Take by mouth daily.      dutasteride (AVODART) 0.5 MG capsule Take 0.5 mg by mouth daily.     gabapentin (NEURONTIN) 300 MG capsule Take 1 capsule (300 mg total) by mouth 4 (four) times daily. (Patient taking differently: Take 300 mg by mouth 4 (four) times daily as needed.) 360 capsule 3   omeprazole (PRILOSEC) 20 MG capsule Take 20 mg by mouth daily.     triamcinolone cream (KENALOG) 0.1 % Apply 1 application topically daily as needed for rash.     Vitamin D, Ergocalciferol, (DRISDOL) 1.25 MG (50000 UNIT) CAPS capsule Take 50,000 Units by mouth every 7 (seven) days.     rosuvastatin (CRESTOR) 20 MG tablet Take 1 tablet (20 mg total) by mouth daily. 90 tablet 3   No current facility-administered medications for this visit.    Allergies:   Bupivacaine, Alendronate, Bisphosphonates, and Nsaids    Social History:  The patient  reports that he has never smoked. He does not have any smokeless tobacco history on file. He reports that he does not drink alcohol and does not use drugs.  Family History:  The patient's family history includes COPD in his mother; Heart disease in his brother and father.    ROS:  Please see the history of present illness.   Otherwise, review of systems are positive for none.   All other systems are reviewed and negative.    PHYSICAL EXAM: VS:  BP 90/62 (BP Location: Right Arm, Patient Position: Sitting, Cuff Size: Normal)   Pulse (!) 55   Ht 5\' 10"  (1.778 m)   Wt 182 lb (82.6 kg)   SpO2 98%   BMI 26.11 kg/m  , BMI Body mass index is 26.11 kg/m. GEN: Well nourished, well developed, in no acute distress  HEENT: normal  Neck: no JVD, carotid bruits, or masses Cardiac: RRR; no murmurs, rubs, or gallops,no edema  Respiratory:  clear to  auscultation bilaterally, normal work of breathing GI: soft, nontender, nondistended, + BS MS: no deformity or atrophy  Skin: warm and dry, no rash Neuro:  Strength and sensation are intact Psych: euthymic mood, full affect   EKG:  EKG is ordered today. The ekg ordered today demonstrates sinus bradycardia with no significant ST or T wave changes.   Recent Labs: No results found for requested labs within last 365 days.    Lipid Panel No results found for: "CHOL", "TRIG", "HDL", "CHOLHDL", "VLDL", "LDLCALC", "LDLDIRECT"    Wt Readings from Last 3 Encounters:  06/02/22 182 lb (82.6 kg)  02/02/21 180 lb (81.6 kg)  11/22/18 189 lb (85.7 kg)          06/02/2022   10:29 AM  PAD Screen  Previous PAD dx? No  Previous surgical procedure? No  Pain with walking? No  Feet/toe relief with dangling? No  Painful, non-healing ulcers? No  Extremities discolored? No      ASSESSMENT AND PLAN:  1.  Coronary artery disease involving native coronary arteries without angina: Previous cardiac CTA in 2020 showed mild to moderate nonobstructive coronary artery disease.  He is currently without symptoms and thus I recommend continuing aggressive medical therapy.  Continue low-dose aspirin 81 mg once daily.  2.  Hyperlipidemia: I reviewed his recent lipid profile which showed an LDL of 94 and an HDL of 65.  I recommended keeping an LDL below 70.  Thus, I increased rosuvastatin to 20 mg daily.    Disposition:   FU with me in 1 year  Signed,  Kyle Sacramento, MD  06/02/2022 11:10 AM    [place]

## 2022-06-02 ENCOUNTER — Ambulatory Visit: Payer: Medicare Other | Attending: Cardiovascular Disease | Admitting: Cardiovascular Disease

## 2022-06-02 ENCOUNTER — Encounter: Payer: Self-pay | Admitting: Cardiovascular Disease

## 2022-06-02 VITALS — BP 90/62 | HR 55 | Ht 70.0 in | Wt 182.0 lb

## 2022-06-02 DIAGNOSIS — E785 Hyperlipidemia, unspecified: Secondary | ICD-10-CM | POA: Diagnosis present

## 2022-06-02 DIAGNOSIS — I2510 Atherosclerotic heart disease of native coronary artery without angina pectoris: Secondary | ICD-10-CM

## 2022-06-02 MED ORDER — ROSUVASTATIN CALCIUM 20 MG PO TABS: 20.0000 mg | ORAL_TABLET | Freq: Every day | ORAL | 3 refills | Status: DC

## 2022-06-02 NOTE — Patient Instructions (Signed)
Medication Instructions:  INCREASE the Rosuvastatin to 20 mg once daily  *If you need a refill on your cardiac medications before your next appointment, please call your pharmacy*   Lab Work: None ordered If you have labs (blood work) drawn today and your tests are completely normal, you will receive your results only by: [place] (if you have MyChart) OR A paper copy in the mail If you have any lab test that is abnormal or we need to change your treatment, we will call you to review the results.   Testing/Procedures: None ordered   Follow-Up: At [facility], you and your health needs are our priority.  As part of our continuing mission to provide you with exceptional heart care, we have created designated Provider Care Teams.  These Care Teams include your primary Cardiologist (physician) and Advanced Practice Providers (APPs -  Physician Assistants and Nurse Practitioners) who all work together to provide you with the care you need, when you need it.  We recommend signing up for the patient portal called "MyChart".  Sign up information is provided on this After Visit Summary.  MyChart is used to connect with patients for Virtual Visits (Telemedicine).  Patients are able to view lab/test results, encounter notes, upcoming appointments, etc.  Non-urgent messages can be sent to your provider as well.   To learn more about what you can do with MyChart, go to [email or web address].    Your next appointment:   12 month(s)  Provider:   You may see Dr. Fletcher Anon or one of the following Advanced Practice Providers on your designated Care Team:   Murray Hodgkins, NP Christell Faith, PA-C Cadence Kathlen Mody, PA-C Gerrie Nordmann, NP

## 2022-06-29 IMAGING — MR MR LUMBAR SPINE WO/W CM
4 of 7 series · 18 of 48 positions shown · IV contrast (multihance)
Comparison: None available.

CLINICAL DATA: Low back pain, left-sided radiculopathy, difficulty
voiding

EXAM:
MRI LUMBAR SPINE WITHOUT AND WITH CONTRAST
TECHNIQUE: Multiplanar and multiecho pulse sequences of the lumbar spine were
obtained without and with intravenous contrast.
CONTRAST:  18mL MULTIHANCE GADOBENATE DIMEGLUMINE 529 MG/ML IV SOLN

[Series 5: T1 · sagittal · 4.0mm · 0.73mm/px · 3 of 15 slices shown (1 of 2)]
[im 1/15]
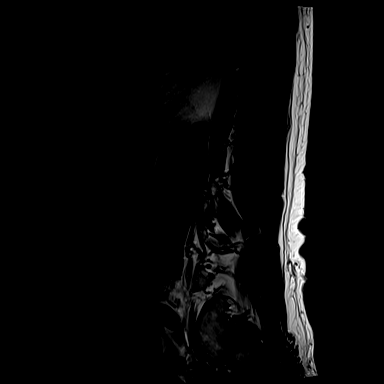
[im 8/15]
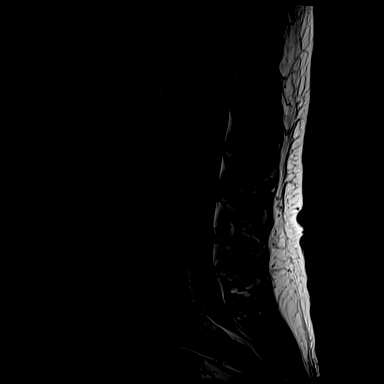
[im 15/15]
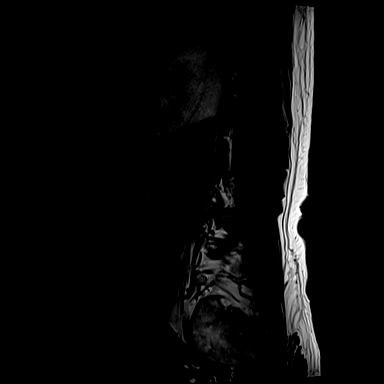

[Series 9: T1 · axial · 4.0mm · 0.28mm/px · z∈[-26,+145]mm · 3 of 39 slices shown (2 of 2)]
[im 4/39]
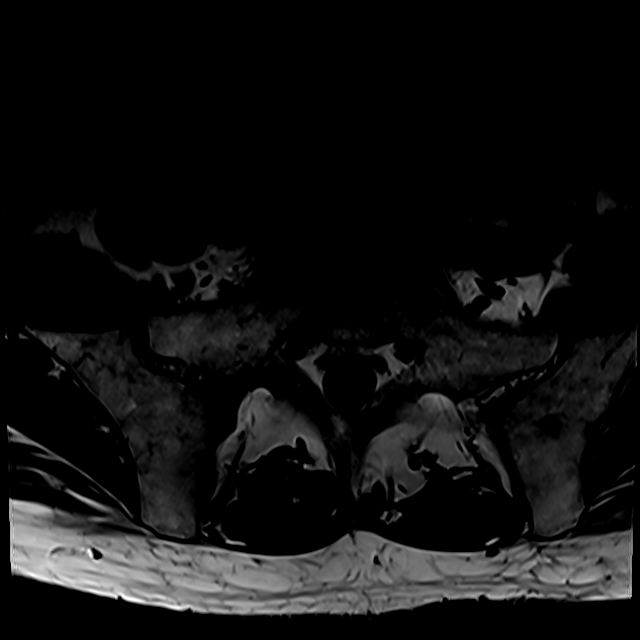
[im 20/39]
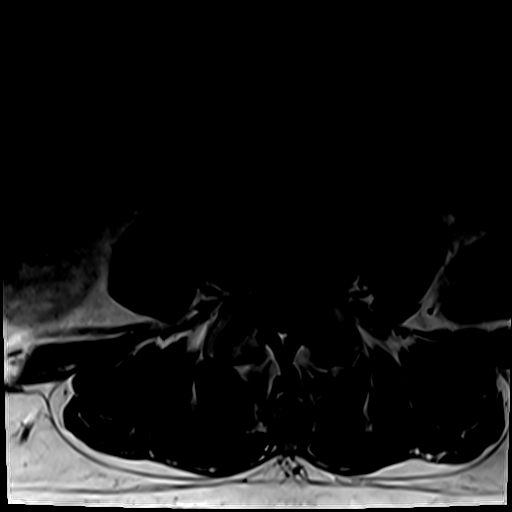
[im 35/39]
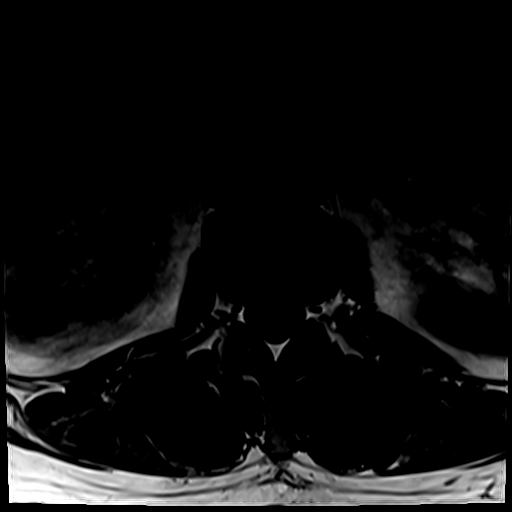

[Series 12: T2 · axial · 4.0mm · 0.28mm/px · z∈[-40,+145]mm · 9 of 39 slices shown (1 of 2)]
[im 1/39]
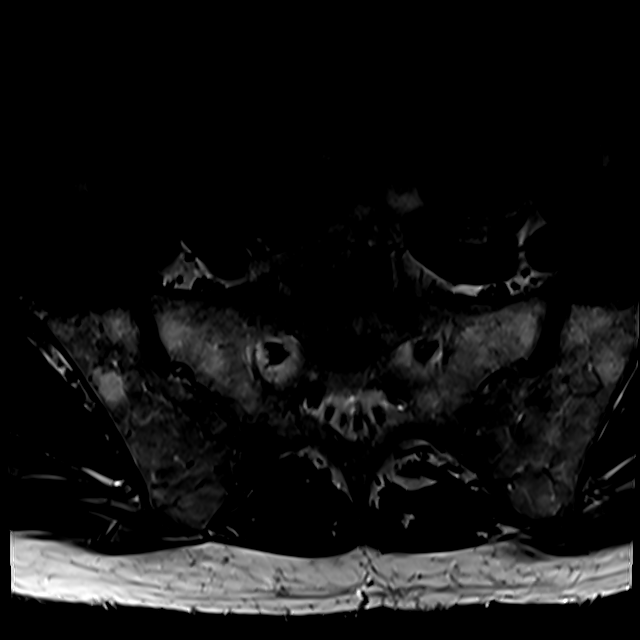
[im 4/39]
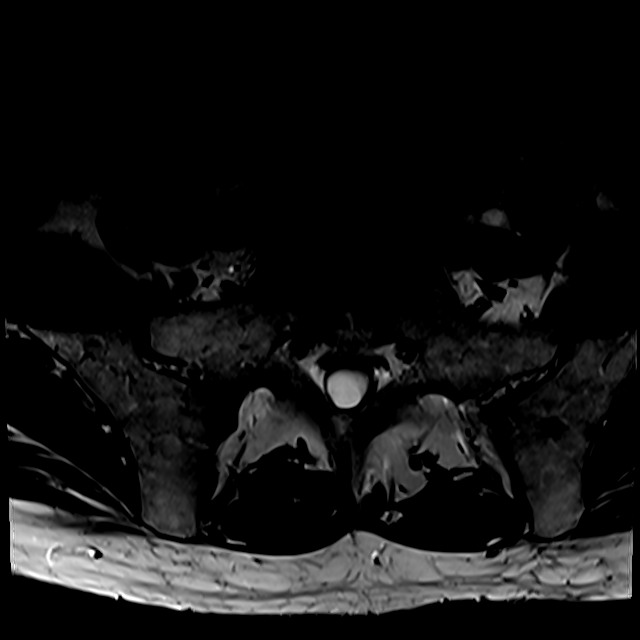
[im 8/39]
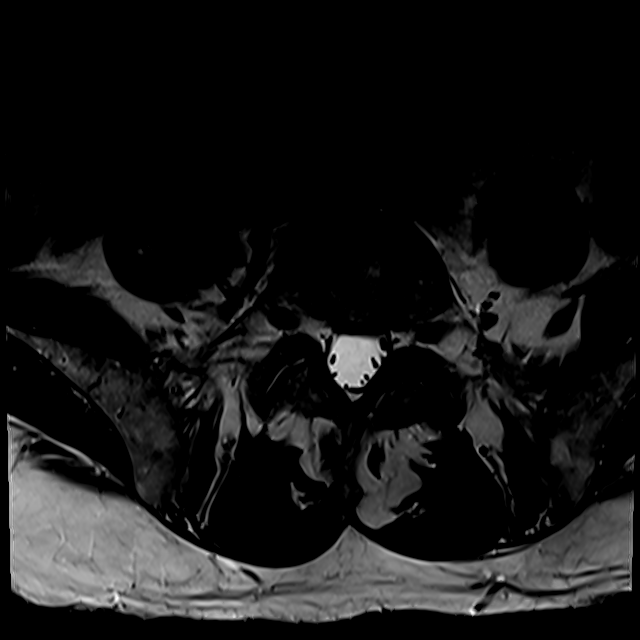
[im 12/39]
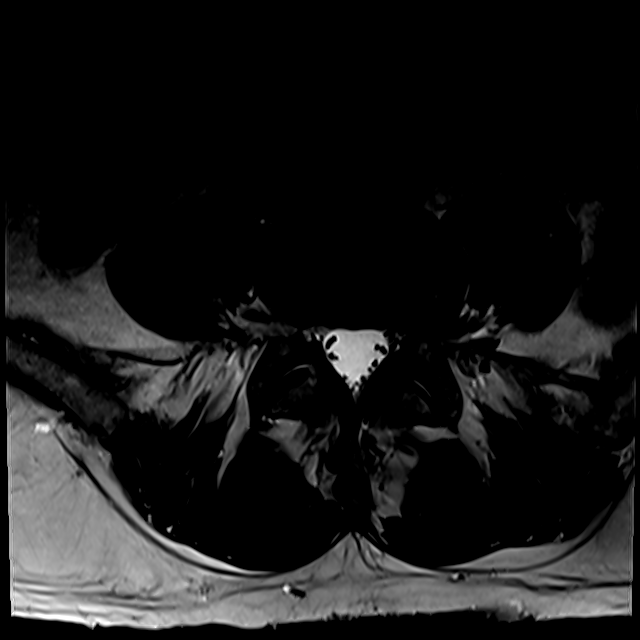
[im 16/39]
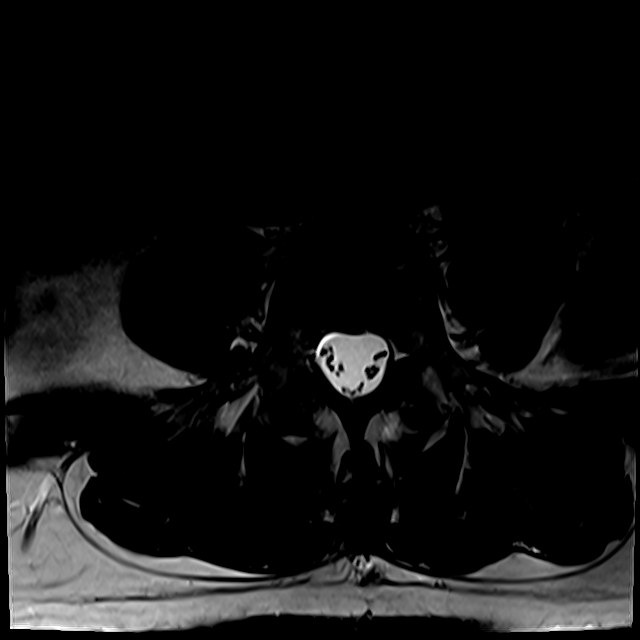
[im 20/39]
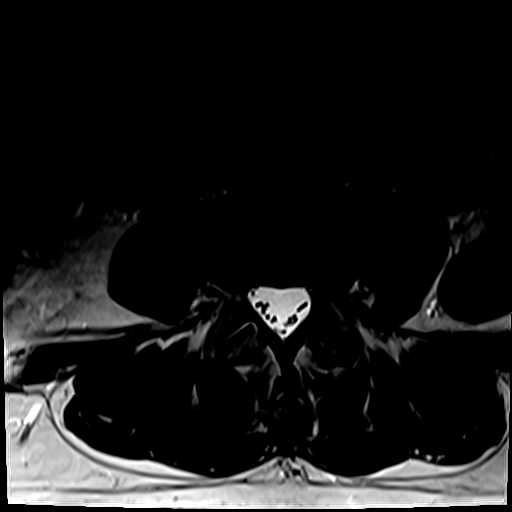
[im 23/39]
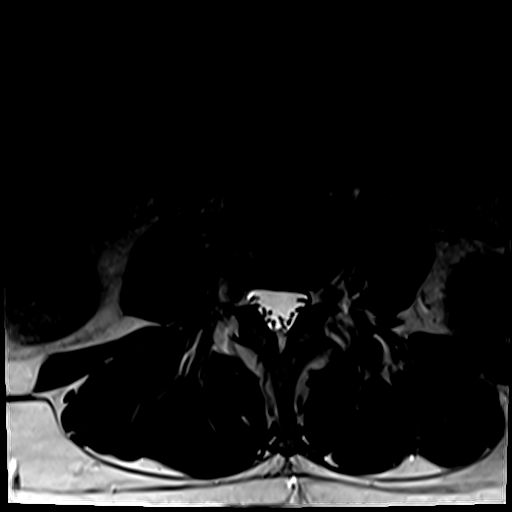
[im 27/39]
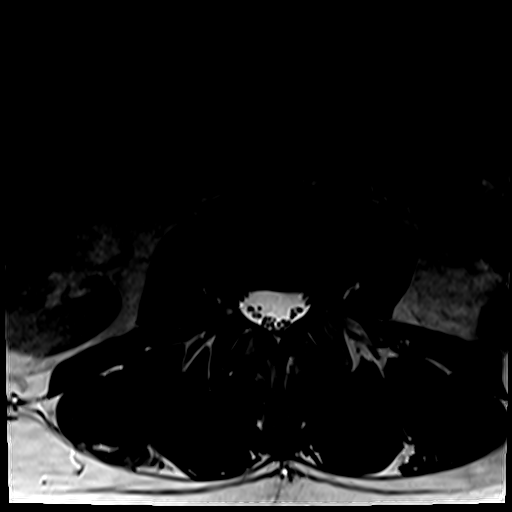
[im 35/39]
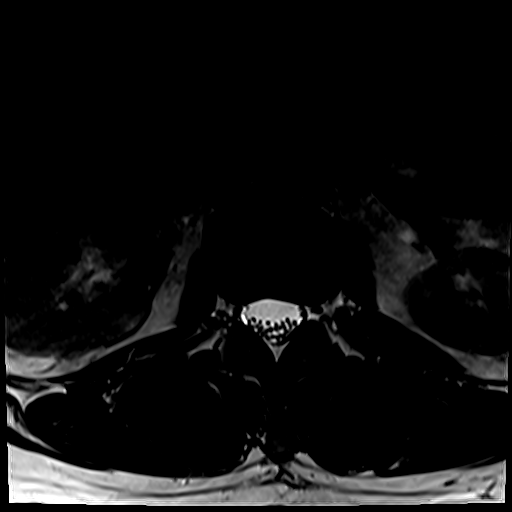

[Series 13: T2 · sagittal · 4.0mm · 0.73mm/px · 3 of 15 slices shown (2 of 2)]
[im 1/15]
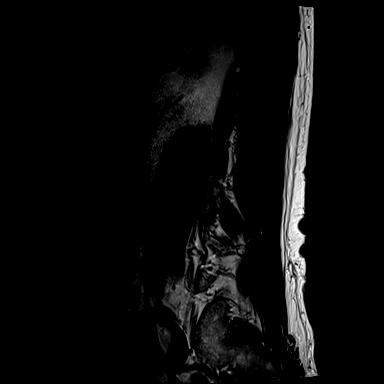
[im 10/15]
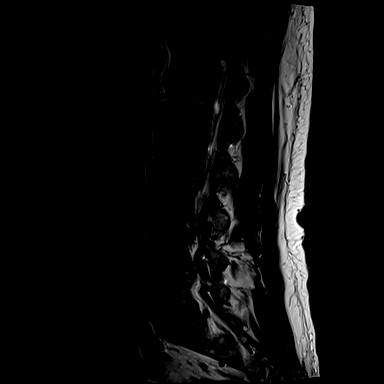
[im 15/15]
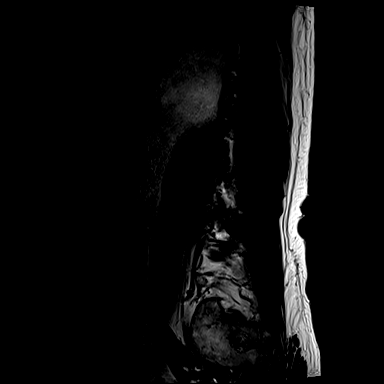

[18 of 48 positions shown; findings below may reference images not displayed]

FINDINGS: Segmentation:  Standard.

Alignment:  Mild levocurvature.  No listhesis.

Vertebrae: No acute fracture or suspicious osseous lesion. Schmorl's
node at the inferior aspect of L3 and superior aspect of L5.

Conus medullaris and cauda equina: Conus extends to the L1 level.
Conus and cauda equina appear normal. No abnormal enhancement.

Paraspinal and other soft tissues: Atrophy of the inferior
paraspinous muscles.

Disc levels:

T12-L1: Seen only on the sagittal images. No significant disc bulge,
spinal canal stenosis, or neural foraminal narrowing.

L1-L2: No significant disc bulge. No spinal canal stenosis or neural
foraminal narrowing.

L2-L3: Minimal disc bulge with superimposed left subarticular disc
protrusion. Mild facet arthropathy. No spinal canal stenosis.
Narrowing of the left lateral recess. No neural foraminal narrowing.

L3-L4: Mild disc bulge. Mild facet arthropathy. No spinal canal
stenosis. Mild narrowing of the lateral recesses. No neural
foraminal narrowing.

L4-L5: Mild disc bulge with left foraminal and extreme lateral
protrusion, which likely contacts the exiting left L4 nerve (series
12, image 27). Mild facet arthropathy. No spinal canal stenosis. No
neural foraminal narrowing.

L5-S1: Mild disc bulge. Mild facet arthropathy. No spinal canal
stenosis no neural foraminal narrowing.
IMPRESSION: 1. L4-L5 left extreme lateral disc protrusion likely contacts the
exiting left L4 nerve. Correlate with symptoms.
2. Mild degenerative changes without spinal canal stenosis or neural
foraminal narrowing.
3. Narrowing of the lateral recesses on the left at L2-L3 and
bilaterally at L3-L4, which could affect the descending left L3 and
bilateral L4 nerve roots.

## 2022-08-01 ENCOUNTER — Ambulatory Visit: Payer: Medicare Other | Admitting: Cardiovascular Disease

## 2022-11-03 ENCOUNTER — Ambulatory Visit
Admission: RE | Admit: 2022-11-03 | Discharge: 2022-11-03 | Disposition: A | Discharge status: Home / Self Care | Payer: Medicare Other | Source: Ambulatory Visit | Attending: Podiatry | Admitting: Podiatry

## 2022-11-03 ENCOUNTER — Other Ambulatory Visit: Payer: Self-pay | Admitting: Podiatry

## 2022-11-03 DIAGNOSIS — R2242 Localized swelling, mass and lump, left lower limb: Secondary | ICD-10-CM

## 2023-04-30 ENCOUNTER — Encounter: Payer: Self-pay | Admitting: Podiatry

## 2023-05-01 ENCOUNTER — Other Ambulatory Visit: Payer: Self-pay | Admitting: Podiatry

## 2023-05-01 DIAGNOSIS — S92425A Nondisplaced fracture of distal phalanx of left great toe, initial encounter for closed fracture: Secondary | ICD-10-CM

## 2023-05-03 ENCOUNTER — Ambulatory Visit
Admission: RE | Admit: 2023-05-03 | Discharge: 2023-05-03 | Discharge status: Home / Self Care | Payer: Medicare Other | Source: Ambulatory Visit | Attending: Podiatry | Admitting: Podiatry

## 2023-05-03 DIAGNOSIS — S92425A Nondisplaced fracture of distal phalanx of left great toe, initial encounter for closed fracture: Secondary | ICD-10-CM

## 2023-10-19 ENCOUNTER — Encounter: Payer: Self-pay | Admitting: Internal Medicine

## 2023-10-19 ENCOUNTER — Ambulatory Visit (INDEPENDENT_AMBULATORY_CARE_PROVIDER_SITE_OTHER): Admitting: Internal Medicine

## 2023-10-19 VITALS — BP 126/80 | HR 61 | Temp 97.5°F | Ht 69.0 in | Wt 184.0 lb

## 2023-10-19 DIAGNOSIS — R7303 Prediabetes: Secondary | ICD-10-CM

## 2023-10-19 DIAGNOSIS — H93A2 Pulsatile tinnitus, left ear: Secondary | ICD-10-CM

## 2023-10-19 DIAGNOSIS — M4722 Other spondylosis with radiculopathy, cervical region: Secondary | ICD-10-CM

## 2023-10-19 DIAGNOSIS — E7800 Pure hypercholesterolemia, unspecified: Secondary | ICD-10-CM

## 2023-10-19 DIAGNOSIS — N401 Enlarged prostate with lower urinary tract symptoms: Secondary | ICD-10-CM

## 2023-10-19 DIAGNOSIS — R350 Frequency of micturition: Secondary | ICD-10-CM

## 2023-10-19 DIAGNOSIS — L309 Dermatitis, unspecified: Secondary | ICD-10-CM

## 2023-10-19 DIAGNOSIS — E559 Vitamin D deficiency, unspecified: Secondary | ICD-10-CM

## 2023-10-19 DIAGNOSIS — R0683 Snoring: Secondary | ICD-10-CM | POA: Diagnosis not present

## 2023-10-19 DIAGNOSIS — Z0130 Encounter for examination of blood pressure without abnormal findings: Secondary | ICD-10-CM

## 2023-10-19 MED ORDER — SULINDAC 200 MG PO TABS: 200.0000 mg | ORAL_TABLET | Freq: Two times a day (BID) | ORAL | 5 refills | Status: AC

## 2023-10-19 MED ORDER — TRIAMCINOLONE ACETONIDE 0.1 % EX CREA: 1.0000 | TOPICAL_CREAM | Freq: Every day | CUTANEOUS | 11 refills | Status: AC | PRN

## 2023-10-19 MED ORDER — ATORVASTATIN CALCIUM 40 MG PO TABS: 40.0000 mg | ORAL_TABLET | Freq: Every day | ORAL | 3 refills | Status: AC

## 2023-10-19 MED ORDER — TAMSULOSIN HCL 0.4 MG PO CAPS: 0.4000 mg | ORAL_CAPSULE | Freq: Every day | ORAL | 3 refills | Status: AC

## 2023-10-19 NOTE — Progress Notes (Signed)
 New Patient Office Visit  Subjective:  Patient ID: Kyle Mack, male    DOB: Feb 27, 1951  Age: 73 y.o. MRN: [id number]  Chief Complaint  Patient presents with   New Patient (Initial Visit)    New Patient     Here to establish IM care, his clinical summary was reviewed. He denies any new complaints today with pmh of hyperlipidemia, cervical radiculopathy and bph. C/o daytime somnolence and wife reports he snores.    No other concerns at this time.   Past Medical History:  Diagnosis Date   Cervical spine pain    GERD (gastroesophageal reflux disease)    Hyperlipidemia    Osteopenia     Past Surgical History:  Procedure Laterality Date   AMPUTATION Left 02/02/2021   Procedure: AMPUTATION DIGIT;  Surgeon: Elisabeth Craig RAMAN, MD;  Location: [facility];  Service: Plastics;  Laterality: Left;   ANKLE SURGERY Right    CARDIAC CATHETERIZATION     1999 no stents   FINGER SURGERY Right 4th digit   ROTATOR CUFF REPAIR Left     Social History   Socioeconomic History   Marital status: Married    Spouse name: Not on file   Number of children: Not on file   Years of education: Not on file   Highest education level: Not on file  Occupational History   Not on file  Tobacco Use   Smoking status: Never   Smokeless tobacco: Not on file  Vaping Use   Vaping status: Never Used  Substance and Sexual Activity   Alcohol use: No    Alcohol/week: 0.0 standard drinks of alcohol   Drug use: No   Sexual activity: Not on file  Other Topics Concern   Not on file  Social History Narrative   Not on file   Social Drivers of Health   Financial Resource Strain: Not on file  Food Insecurity: Not on file  Transportation Needs: Not on file  Physical Activity: Not on file  Stress: Not on file  Social Connections: Not on file  Intimate Partner Violence: Not on file    Family History  Problem Relation Age of Onset   COPD Mother    Heart disease Father    Heart disease Brother      Allergies  Allergen Reactions   Bupivacaine Other (See Comments)    Hypesthesia per patient   Metoclopramide Other (See Comments)   Alendronate    Bisphosphonates    Nsaids Nausea And Vomiting    Mainly IBU   Oxycodone And Oxymorphone     Severe constipation    Outpatient Medications Prior to Visit  Medication Sig   aspirin EC 81 MG tablet Take 81 mg by mouth daily.   Calcium  Carbonate-Vitamin D  (CALCIUM -VITAMIN D3) 600-125 MG-UNIT TABS Take by mouth daily.    dutasteride (AVODART) 0.5 MG capsule Take 0.5 mg by mouth daily.   gabapentin  (NEURONTIN ) 300 MG capsule Take 1 capsule (300 mg total) by mouth 4 (four) times daily.   omeprazole (PRILOSEC) 20 MG capsule Take 20 mg by mouth daily.   Vitamin D , Ergocalciferol , (DRISDOL) 1.25 MG (50000 UNIT) CAPS capsule Take 50,000 Units by mouth every 7 (seven) days.   [DISCONTINUED] atorvastatin  (LIPITOR) 40 MG tablet Take 40 mg by mouth daily.   [DISCONTINUED] triamcinolone  cream (KENALOG ) 0.1 % Apply 1 application topically daily as needed for rash.   sulindac  (CLINORIL ) 150 MG tablet TAKE 1 TABLET BY MOUTH WITH FOOD TWICE DAILY   [DISCONTINUED] rosuvastatin  (  CRESTOR ) 20 MG tablet Take 1 tablet (20 mg total) by mouth daily. (Patient not taking: Reported on 10/19/2023)   [DISCONTINUED] tamsulosin  (FLOMAX ) 0.4 MG CAPS capsule Take 1 capsule by mouth daily.   No facility-administered medications prior to visit.    Review of Systems  Constitutional:  Negative for malaise/fatigue and weight loss.  HENT:  Positive for tinnitus.   Eyes: Negative.   Respiratory: Negative.    Cardiovascular:  Negative for chest pain and palpitations.  Gastrointestinal: Negative.   Genitourinary:  Positive for frequency and urgency.  Musculoskeletal:  Positive for joint pain (feet).  Neurological: Negative.   Endo/Heme/Allergies: Negative.        Objective:   BP 126/80   Pulse 61   Temp (!) 97.5 F (36.4 C) (Temporal)   Ht 5' 9 (1.753 m)   Wt  184 lb (83.5 kg)   SpO2 97%   BMI 27.17 kg/m   Vitals:   10/19/23 1042  BP: 126/80  Pulse: 61  Temp: (!) 97.5 F (36.4 C)  Height: 5' 9 (1.753 m)  Weight: 184 lb (83.5 kg)  SpO2: 97%  TempSrc: Temporal  BMI (Calculated): 27.16    Physical Exam Vitals reviewed.  Constitutional:      Appearance: Normal appearance.  HENT:     Head: Normocephalic.     Left Ear: There is no impacted cerumen.     Nose: Nose normal.     Mouth/Throat:     Mouth: Mucous membranes are moist.     Pharynx: No posterior oropharyngeal erythema.  Eyes:     Extraocular Movements: Extraocular movements intact.     Pupils: Pupils are equal, round, and reactive to light.  Cardiovascular:     Rate and Rhythm: Regular rhythm.     Chest Wall: PMI is not displaced.     Pulses: Normal pulses.     Heart sounds: Normal heart sounds. No murmur heard. Pulmonary:     Effort: Pulmonary effort is normal.     Breath sounds: Normal air entry. No rhonchi or rales.  Abdominal:     General: Abdomen is flat. Bowel sounds are normal. There is no distension.     Palpations: Abdomen is soft. There is no hepatomegaly, splenomegaly or mass.     Tenderness: There is no abdominal tenderness.  Musculoskeletal:        General: Normal range of motion.     Cervical back: Normal range of motion and neck supple.     Right lower leg: No edema.     Left lower leg: No edema.  Skin:    General: Skin is warm and dry.  Neurological:     General: No focal deficit present.     Mental Status: He is alert and oriented to person, place, and time.     Cranial Nerves: No cranial nerve deficit.     Motor: No weakness.  Psychiatric:        Mood and Affect: Mood normal.        Behavior: Behavior normal.      No results found for any visits on 10/19/23.  No results found for this or any previous visit (from the past 2160 hours).    Assessment & Plan:  [place] was seen today for new patient (initial visit).  Pulsatile tinnitus of  left ear  Cervical spondylosis with radiculopathy (sensory) (Left) -     Sulindac ; Take 1 tablet (200 mg total) by mouth 2 (two) times daily.  Dispense: 60 tablet;  Refill: 5  Prediabetes  Snoring -     Ambulatory referral to Sleep Studies  Pure hypercholesterolemia -     Atorvastatin  Calcium ; Take 1 tablet (40 mg total) by mouth daily.  Dispense: 90 tablet; Refill: 3 -     Comprehensive metabolic panel with GFR -     Lipid panel -     TSH  Benign prostatic hyperplasia with urinary frequency -     Tamsulosin  HCl; Take 1 capsule (0.4 mg total) by mouth daily.  Dispense: 90 capsule; Refill: 3 -     PSA  Dermatitis -     Triamcinolone  Acetonide; Apply 1 Application topically daily as needed.  Dispense: 30 g; Refill: 11 -     CBC With Diff/Platelet  Vitamin D  deficiency -     VITAMIN D  25 Hydroxy (Vit-D Deficiency, Fractures)    Problem List Items Addressed This Visit       Nervous and Auditory   Cervical spondylosis with radiculopathy (sensory) (Left) (Chronic)   Relevant Medications   sulindac  (CLINORIL ) 200 MG tablet   Other Visit Diagnoses       Pulsatile tinnitus of left ear    -  Primary     Prediabetes         Snoring       Relevant Orders   Ambulatory referral to Sleep Studies     Pure hypercholesterolemia       Relevant Medications   atorvastatin  (LIPITOR) 40 MG tablet   Other Relevant Orders   Comprehensive metabolic panel with GFR   Lipid panel   TSH     Benign prostatic hyperplasia with urinary frequency       Relevant Medications   tamsulosin  (FLOMAX ) 0.4 MG CAPS capsule   Other Relevant Orders   PSA     Dermatitis       Relevant Medications   triamcinolone  cream (KENALOG ) 0.1 %   Other Relevant Orders   CBC With Diff/Platelet     Vitamin D  deficiency       Relevant Orders   Vitamin D  (25 hydroxy)       Return in about 1 month (around 11/19/2023) for awv with labs prior.   Total time spent: 30 minutes  Sherrill Cinderella Perry,  MD  10/19/2023   This document may have been prepared by [facility] Voice Recognition software and as such may include unintentional dictation errors.

## 2023-10-20 LAB — COMPREHENSIVE METABOLIC PANEL WITH GFR
ALT: 30 IU/L (ref 0–44)
AST: 26 IU/L (ref 0–40)
Albumin: 4.6 g/dL (ref 3.8–4.8)
Alkaline Phosphatase: 62 IU/L (ref 44–121)
BUN/Creatinine Ratio: 18 (ref 10–24)
BUN: 24 mg/dL (ref 8–27)
Bilirubin Total: 0.5 mg/dL (ref 0.0–1.2)
CO2: 21 mmol/L (ref 20–29)
Calcium: 10.5 mg/dL — ABNORMAL HIGH (ref 8.6–10.2)
Chloride: 101 mmol/L (ref 96–106)
Creatinine, Ser: 1.31 mg/dL — ABNORMAL HIGH (ref 0.76–1.27)
Globulin, Total: 2.2 g/dL (ref 1.5–4.5)
Glucose: 85 mg/dL (ref 70–99)
Potassium: 4.3 mmol/L (ref 3.5–5.2)
Sodium: 137 mmol/L (ref 134–144)
Total Protein: 6.8 g/dL (ref 6.0–8.5)
eGFR: 57 mL/min/1.73 — ABNORMAL LOW (ref >59)

## 2023-10-20 LAB — CBC WITH DIFF/PLATELET
Basophils Absolute: 0 x10E3/uL (ref 0.0–0.2)
Basos: 1 %
EOS (ABSOLUTE): 0.1 x10E3/uL (ref 0.0–0.4)
Eos: 1 %
Hematocrit: 44.1 % (ref 37.5–51.0)
Hemoglobin: 14.4 g/dL (ref 13.0–17.7)
Immature Grans (Abs): 0 x10E3/uL (ref 0.0–0.1)
Immature Granulocytes: 0 %
Lymphocytes Absolute: 2 x10E3/uL (ref 0.7–3.1)
Lymphs: 31 %
MCH: 29 pg (ref 26.6–33.0)
MCHC: 32.7 g/dL (ref 31.5–35.7)
MCV: 89 fL (ref 79–97)
Monocytes Absolute: 0.6 x10E3/uL (ref 0.1–0.9)
Monocytes: 8 %
Neutrophils Absolute: 3.9 x10E3/uL (ref 1.4–7.0)
Neutrophils: 59 %
Platelets: 258 x10E3/uL (ref 150–450)
RBC: 4.96 x10E6/uL (ref 4.14–5.80)
RDW: 13.3 % (ref 11.6–15.4)
WBC: 6.6 x10E3/uL (ref 3.4–10.8)

## 2023-10-20 LAB — LIPID PANEL
Chol/HDL Ratio: 2.7 ratio (ref 0.0–5.0)
Cholesterol, Total: 177 mg/dL (ref 100–199)
HDL: 65 mg/dL (ref >39)
LDL Chol Calc (NIH): 101 mg/dL — ABNORMAL HIGH (ref 0–99)
Triglycerides: 58 mg/dL (ref 0–149)
VLDL Cholesterol Cal: 11 mg/dL (ref 5–40)

## 2023-10-20 LAB — VITAMIN D 25 HYDROXY (VIT D DEFICIENCY, FRACTURES): Vit D, 25-Hydroxy: 33.1 ng/mL (ref 30.0–100.0)

## 2023-10-20 LAB — PSA: Prostate Specific Ag, Serum: 1.6 ng/mL (ref 0.0–4.0)

## 2023-10-20 LAB — TSH: TSH: 2.75 u[IU]/mL (ref 0.450–4.500)

## 2023-11-19 ENCOUNTER — Ambulatory Visit: Payer: Self-pay

## 2023-11-20 ENCOUNTER — Encounter: Payer: Self-pay | Admitting: Internal Medicine

## 2023-11-20 ENCOUNTER — Ambulatory Visit (INDEPENDENT_AMBULATORY_CARE_PROVIDER_SITE_OTHER): Admitting: Internal Medicine

## 2023-11-20 VITALS — BP 113/81 | HR 100 | Temp 97.7°F | Ht 69.0 in | Wt 187.4 lb

## 2023-11-20 DIAGNOSIS — G4733 Obstructive sleep apnea (adult) (pediatric): Secondary | ICD-10-CM

## 2023-11-20 DIAGNOSIS — E559 Vitamin D deficiency, unspecified: Secondary | ICD-10-CM

## 2023-11-20 DIAGNOSIS — Z0001 Encounter for general adult medical examination with abnormal findings: Secondary | ICD-10-CM | POA: Diagnosis not present

## 2023-11-20 DIAGNOSIS — E7800 Pure hypercholesterolemia, unspecified: Secondary | ICD-10-CM | POA: Diagnosis not present

## 2023-11-20 DIAGNOSIS — R7303 Prediabetes: Secondary | ICD-10-CM | POA: Diagnosis not present

## 2023-11-20 DIAGNOSIS — R350 Frequency of micturition: Secondary | ICD-10-CM

## 2023-11-20 DIAGNOSIS — Z0000 Encounter for general adult medical examination without abnormal findings: Secondary | ICD-10-CM

## 2023-11-20 DIAGNOSIS — Z0130 Encounter for examination of blood pressure without abnormal findings: Secondary | ICD-10-CM

## 2023-11-20 DIAGNOSIS — N401 Enlarged prostate with lower urinary tract symptoms: Secondary | ICD-10-CM

## 2023-11-20 NOTE — Progress Notes (Signed)
 Established Patient Office Visit  Subjective:  Patient ID: Kyle Mack, male    DOB: 06-24-1950  Age: 73 y.o. MRN: [id number]  Chief Complaint  Patient presents with   Follow-up    1 month follow up    No new complaints, here for AWV refer to quality metrics and scanned documents.   Diagnosed with sleep apnea and awaiting cpap machine. Labs reviewed and notable for elevated Cr but admits to having labs drawn after rounds of golf. LDL slightly high with normal cbc and psa.    No other concerns at this time.   Past Medical History:  Diagnosis Date   Cervical spine pain    GERD (gastroesophageal reflux disease)    Hyperlipidemia    Osteopenia     Past Surgical History:  Procedure Laterality Date   AMPUTATION Left 02/02/2021   Procedure: AMPUTATION DIGIT;  Surgeon: Elisabeth Craig RAMAN, MD;  Location: [facility];  Service: Plastics;  Laterality: Left;   ANKLE SURGERY Right    CARDIAC CATHETERIZATION     1999 no stents   FINGER SURGERY Right 4th digit   ROTATOR CUFF REPAIR Left     Social History   Socioeconomic History   Marital status: Married    Spouse name: Not on file   Number of children: Not on file   Years of education: Not on file   Highest education level: Not on file  Occupational History   Not on file  Tobacco Use   Smoking status: Never   Smokeless tobacco: Not on file  Vaping Use   Vaping status: Never Used  Substance and Sexual Activity   Alcohol use: No    Alcohol/week: 0.0 standard drinks of alcohol   Drug use: No   Sexual activity: Not on file  Other Topics Concern   Not on file  Social History Narrative   Not on file   Social Drivers of Health   Financial Resource Strain: Not on file  Food Insecurity: Not on file  Transportation Needs: Not on file  Physical Activity: Not on file  Stress: Not on file  Social Connections: Not on file  Intimate Partner Violence: Not on file    Family History  Problem Relation Age of Onset   COPD Mother     Heart disease Father    Heart disease Brother     Allergies  Allergen Reactions   Bupivacaine Other (See Comments)    Hypesthesia per patient   Metoclopramide Other (See Comments)   Alendronate    Bisphosphonates    Nsaids Nausea And Vomiting    Mainly IBU   Oxycodone And Oxymorphone     Severe constipation    Outpatient Medications Prior to Visit  Medication Sig   aspirin EC 81 MG tablet Take 81 mg by mouth daily.   atorvastatin  (LIPITOR) 40 MG tablet Take 1 tablet (40 mg total) by mouth daily.   Calcium  Carbonate-Vitamin D  (CALCIUM -VITAMIN D3) 600-125 MG-UNIT TABS Take by mouth daily.    dutasteride (AVODART) 0.5 MG capsule Take 0.5 mg by mouth daily.   gabapentin  (NEURONTIN ) 300 MG capsule Take 1 capsule (300 mg total) by mouth 4 (four) times daily.   omeprazole (PRILOSEC) 20 MG capsule Take 20 mg by mouth daily.   sulindac  (CLINORIL ) 150 MG tablet TAKE 1 TABLET BY MOUTH WITH FOOD TWICE DAILY   sulindac  (CLINORIL ) 200 MG tablet Take 1 tablet (200 mg total) by mouth 2 (two) times daily.   tamsulosin  (FLOMAX ) 0.4 MG CAPS  capsule Take 1 capsule (0.4 mg total) by mouth daily.   triamcinolone  cream (KENALOG ) 0.1 % Apply 1 Application topically daily as needed.   Vitamin D , Ergocalciferol , (DRISDOL) 1.25 MG (50000 UNIT) CAPS capsule Take 50,000 Units by mouth every 7 (seven) days.   No facility-administered medications prior to visit.    Review of Systems  Constitutional:  Negative for malaise/fatigue and weight loss.  HENT:  Positive for tinnitus.   Eyes: Negative.   Respiratory: Negative.    Cardiovascular:  Negative for chest pain and palpitations.  Gastrointestinal: Negative.   Genitourinary:  Positive for frequency and urgency.  Musculoskeletal:  Positive for joint pain (feet).  Neurological: Negative.   Endo/Heme/Allergies: Negative.        Objective:   BP 113/81   Pulse 100   Temp 97.7 F (36.5 C)   Ht 5' 9 (1.753 m)   Wt 187 lb 6.4 oz (85 kg)   SpO2  96%   BMI 27.67 kg/m   Vitals:   11/20/23 1056  BP: 113/81  Pulse: 100  Temp: 97.7 F (36.5 C)  Height: 5' 9 (1.753 m)  Weight: 187 lb 6.4 oz (85 kg)  SpO2: 96%  BMI (Calculated): 27.66    Physical Exam Vitals reviewed.  Constitutional:      Appearance: Normal appearance.  HENT:     Head: Normocephalic.     Left Ear: There is no impacted cerumen.     Nose: Nose normal.     Mouth/Throat:     Mouth: Mucous membranes are moist.     Pharynx: No posterior oropharyngeal erythema.  Eyes:     Extraocular Movements: Extraocular movements intact.     Pupils: Pupils are equal, round, and reactive to light.  Cardiovascular:     Rate and Rhythm: Regular rhythm.     Chest Wall: PMI is not displaced.     Pulses: Normal pulses.     Heart sounds: Normal heart sounds. No murmur heard. Pulmonary:     Effort: Pulmonary effort is normal.     Breath sounds: Normal air entry. No rhonchi or rales.  Abdominal:     General: Abdomen is flat. Bowel sounds are normal. There is no distension.     Palpations: Abdomen is soft. There is no hepatomegaly, splenomegaly or mass.     Tenderness: There is no abdominal tenderness.  Musculoskeletal:        General: Normal range of motion.     Cervical back: Normal range of motion and neck supple.     Right lower leg: No edema.     Left lower leg: No edema.  Skin:    General: Skin is warm and dry.  Neurological:     General: No focal deficit present.     Mental Status: He is alert and oriented to person, place, and time.     Cranial Nerves: No cranial nerve deficit.     Motor: No weakness.  Psychiatric:        Mood and Affect: Mood normal.        Behavior: Behavior normal.      No results found for any visits on 11/20/23.  Recent Results (from the past 2160 hours)  Comprehensive metabolic panel with GFR     Status: Abnormal   Collection Time: 10/19/23  5:09 PM  Result Value Ref Range   Glucose 85 70 - 99 mg/dL   BUN 24 8 - 27 mg/dL    Creatinine, Ser 8.68 (H) 0.76 -  1.27 mg/dL   eGFR 57 (L) >40 fO/fpw/8.26   BUN/Creatinine Ratio 18 10 - 24   Sodium 137 134 - 144 mmol/L   Potassium 4.3 3.5 - 5.2 mmol/L   Chloride 101 96 - 106 mmol/L   CO2 21 20 - 29 mmol/L   Calcium  10.5 (H) 8.6 - 10.2 mg/dL   Total Protein 6.8 6.0 - 8.5 g/dL   Albumin 4.6 3.8 - 4.8 g/dL   Globulin, Total 2.2 1.5 - 4.5 g/dL   Bilirubin Total 0.5 0.0 - 1.2 mg/dL   Alkaline Phosphatase 62 44 - 121 IU/L   AST 26 0 - 40 IU/L   ALT 30 0 - 44 IU/L  Lipid panel     Status: Abnormal   Collection Time: 10/19/23  5:09 PM  Result Value Ref Range   Cholesterol, Total 177 100 - 199 mg/dL   Triglycerides 58 0 - 149 mg/dL   HDL 65 >60 mg/dL   VLDL Cholesterol Cal 11 5 - 40 mg/dL   LDL Chol Calc (NIH) 898 (H) 0 - 99 mg/dL   Chol/HDL Ratio 2.7 0.0 - 5.0 ratio    Comment:                                   T. Chol/HDL Ratio                                             Men  Women                               1/2 Avg.Risk  3.4    3.3                                   Avg.Risk  5.0    4.4                                2X Avg.Risk  9.6    7.1                                3X Avg.Risk 23.4   11.0   CBC With Diff/Platelet     Status: None   Collection Time: 10/19/23  5:09 PM  Result Value Ref Range   WBC 6.6 3.4 - 10.8 x10E3/uL   RBC 4.96 4.14 - 5.80 x10E6/uL   Hemoglobin 14.4 13.0 - 17.7 g/dL   Hematocrit 55.8 62.4 - 51.0 %   MCV 89 79 - 97 fL   MCH 29.0 26.6 - 33.0 pg   MCHC 32.7 31.5 - 35.7 g/dL   RDW 86.6 88.3 - 84.5 %   Platelets 258 150 - 450 x10E3/uL   Neutrophils 59 Not Estab. %   Lymphs 31 Not Estab. %   Monocytes 8 Not Estab. %   Eos 1 Not Estab. %   Basos 1 Not Estab. %   Neutrophils Absolute 3.9 1.4 - 7.0 x10E3/uL   Lymphocytes Absolute 2.0 0.7 - 3.1 x10E3/uL   Monocytes Absolute 0.6 0.1 - 0.9 x10E3/uL   EOS (ABSOLUTE) 0.1  0.0 - 0.4 x10E3/uL   Basophils Absolute 0.0 0.0 - 0.2 x10E3/uL   Immature Granulocytes 0 Not Estab. %   Immature  Grans (Abs) 0.0 0.0 - 0.1 x10E3/uL  PSA     Status: None   Collection Time: 10/19/23  5:09 PM  Result Value Ref Range   Prostate Specific Ag, Serum 1.6 0.0 - 4.0 ng/mL    Comment: Roche ECLIA methodology. According to the American Urological Association, Serum PSA should decrease and remain at undetectable levels after radical prostatectomy. The AUA defines biochemical recurrence as an initial PSA value 0.2 ng/mL or greater followed by a subsequent confirmatory PSA value 0.2 ng/mL or greater. Values obtained with different assay methods or kits cannot be used interchangeably. Results cannot be interpreted as absolute evidence of the presence or absence of malignant disease.   Vitamin D  (25 hydroxy)     Status: None   Collection Time: 10/19/23  5:09 PM  Result Value Ref Range   Vit D, 25-Hydroxy 33.1 30.0 - 100.0 ng/mL    Comment: Vitamin D  deficiency has been defined by the Institute of Medicine and an Endocrine Society practice guideline as a level of serum 25-OH vitamin D  less than 20 ng/mL (1,2). The Endocrine Society went on to further define vitamin D  insufficiency as a level between 21 and 29 ng/mL (2). 1. IOM (Institute of Medicine). 2010. Dietary reference    intakes for calcium  and D. [place]: The    [employer]. 2. Holick MF, Binkley [place], Bischoff-Ferrari HA, et al.    Evaluation, treatment, and prevention of vitamin D     deficiency: an Endocrine Society clinical practice    guideline. JCEM. 2011 Jul; 96(7):1911-30.   TSH     Status: None   Collection Time: 10/19/23  5:09 PM  Result Value Ref Range   TSH 2.750 0.450 - 4.500 uIU/mL      Assessment & Plan:  Siddhant was seen today for follow-up.  Encounter for annual wellness visit  Obstructive sleep apnea syndrome  Prediabetes -     Hemoglobin A1c  Pure hypercholesterolemia -     Comprehensive metabolic panel with GFR -     Lipid panel  Benign prostatic hyperplasia with urinary  frequency -     CBC With Diff/Platelet -     PSA  Vitamin D  deficiency -     VITAMIN D  25 Hydroxy (Vit-D Deficiency, Fractures)  CPAP.  Problem List Items Addressed This Visit   None Visit Diagnoses       Encounter for annual wellness visit    -  Primary     Obstructive sleep apnea syndrome         Prediabetes       Relevant Orders   Hemoglobin A1c     Pure hypercholesterolemia       Relevant Orders   Comprehensive metabolic panel with GFR   Lipid panel     Benign prostatic hyperplasia with urinary frequency       Relevant Orders   CBC With Diff/Platelet   PSA     Vitamin D  deficiency       Relevant Orders   Vitamin D  (25 hydroxy)       Return in about 1 year (around 11/19/2024) for awv with labs prior.   Total time spent: 30 minutes  Sherrill Cinderella Perry, MD  11/20/2023   This document may have been prepared by [facility] Voice Recognition software and as such may include unintentional dictation errors.

## 2023-12-07 ENCOUNTER — Ambulatory Visit: Attending: Cardiovascular Disease | Admitting: Cardiovascular Disease

## 2023-12-07 ENCOUNTER — Encounter: Payer: Self-pay | Admitting: Cardiovascular Disease

## 2023-12-07 VITALS — BP 110/80 | HR 48 | Ht 69.0 in | Wt 188.0 lb

## 2023-12-07 DIAGNOSIS — I2510 Atherosclerotic heart disease of native coronary artery without angina pectoris: Secondary | ICD-10-CM | POA: Diagnosis not present

## 2023-12-07 DIAGNOSIS — E785 Hyperlipidemia, unspecified: Secondary | ICD-10-CM | POA: Diagnosis present

## 2023-12-07 MED ORDER — EZETIMIBE 10 MG PO TABS: 10.0000 mg | ORAL_TABLET | Freq: Every day | ORAL | 3 refills | Status: AC

## 2023-12-07 NOTE — Patient Instructions (Signed)
 Medication Instructions:  START Zetia 10 mg once daily  *If you need a refill on your cardiac medications before your next appointment, please call your pharmacy*  Lab Work: Your provider would like for you to return in 2 months to have the following labs drawn: Lipid and Liver.   Please go to [facility] [address] ([facility] [address], [place] [address] You do not need an appointment.  They are open from 8 am- 4:30 pm.  Lunch from 1:00 pm- 2:00 pm You will need to be fasting.   You may also go to one of the following [facility]:  [address] [place], [place] [address] Phone: [phone] Lab hours: Mon-Fri 8 am- 5 pm    Lunch 12 pm- 1 pm  [address] [place],  [place]  [address]  [place]  Phone: [phone] Lab hours: 7 am- 4 pm Lunch 12 pm-1 pm   [address] [place],  [place]  [address]  [place]  Phone: [phone] Lab hours: Mon-Fri 8 am- 5 pm    Lunch 12 pm- 1 pm  If you have labs (blood work) drawn today and your tests are completely normal, you will receive your results only by: MyChart Message (if you have MyChart) OR A paper copy in the mail If you have any lab test that is abnormal or we need to change your treatment, we will call you to review the results.  Testing/Procedures: None ordered  Follow-Up: At [facility], you and your health needs are our priority.  As part of our continuing mission to provide you with exceptional heart care, our providers are all part of one team.  This team includes your primary Cardiologist (physician) and Advanced Practice Providers or APPs (Physician Assistants and Nurse Practitioners) who all work together to provide you with the care you need, when you need it.  Your next appointment:   12 month(s)  Provider:   You may see Deatrice Cage, MD or one of the following Advanced Practice Providers on your designated Care Team:   Lonni Meager, NP Lesley Maffucci, PA-C Bernardino Bring, PA-C Cadence  Taunton, PA-C Tylene Lunch, NP Barnie Hila, NP    We recommend signing up for the patient portal called MyChart.  Sign up information is provided on this After Visit Summary.  MyChart is used to connect with patients for Virtual Visits (Telemedicine).  Patients are able to view lab/test results, encounter notes, upcoming appointments, etc.  Non-urgent messages can be sent to your provider as well.   To learn more about what you can do with MyChart, go to [email or web address].

## 2023-12-07 NOTE — Progress Notes (Signed)
 Cardiology Office Note   Date:  12/07/2023   ID:  Kyle Mack, DOB 1950-07-17, MRN [id number]  PCP:  Albina GORMAN Dine, MD  Cardiologist:   Deatrice Cage, MD   Chief Complaint  Patient presents with   Follow-up    OD 12 month f/u no complaints today. Meds reviewed verbally with pt.      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  He is here for follow-up visit regarding mild nonobstructive coronary artery disease.   He had previous cardiac catheterization in 1999 in [place]  and it was normal. He has known history of hyperlipidemia and strong family history of premature coronary artery disease.  His father died at the age of 66 of myocardial infarction and his brother had myocardial infarction in his late 91s. He is a lifelong non-smoker and has been on statin therapy for many years. He was seen by me in 2020 for palpitations.  He had a nuclear stress test and was able to exercise for 6 minutes.  Nuclear imaging showed evidence of inferior and inferoseptal perfusion defects. He underwent cardiac CTA at that time which showed calcified plaque in the proximal LAD causing less than 50% stenosis and mixed plaque in the left circumflex.  He has been doing well with no chest pain, shortness of breath or palpitations.  He was recently diagnosed with sleep apnea and currently uses a CPAP.  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  He had myalgia with rosuvastatin  but seems to be doing reasonably well with atorvastatin .   Past Medical History:  Diagnosis Date   Cervical spine pain    GERD (gastroesophageal reflux disease)    Hyperlipidemia    Osteopenia     Past Surgical History:  Procedure Laterality Date   AMPUTATION Left 02/02/2021   Procedure: AMPUTATION DIGIT;  Surgeon: Elisabeth Craig GORMAN, MD;  Location: [facility];  Service: Plastics;  Laterality: Left;   ANKLE SURGERY Right    CARDIAC CATHETERIZATION     1999 no  stents   FINGER SURGERY Right 4th digit   ROTATOR CUFF REPAIR Left      Current Outpatient Medications  Medication Sig Dispense Refill   aspirin EC 81 MG tablet Take 81 mg by mouth daily. (Patient taking differently: Take 81 mg by mouth. Taking 3 times a week.)     atorvastatin  (LIPITOR) 40 MG tablet Take 1 tablet (40 mg total) by mouth daily. 90 tablet 3   Calcium  Carbonate-Vitamin D  (CALCIUM -VITAMIN D3) 600-125 MG-UNIT TABS Take by mouth daily.      Cholecalciferol (VITAMIN D -3) 125 MCG (5000 UT) TABS Take by mouth once a week.     dutasteride (AVODART) 0.5 MG capsule Take 0.5 mg by mouth daily. (Patient taking differently: Take 0.5 mg by mouth. Takes 2 times weekly.)     ezetimibe (ZETIA) 10 MG tablet Take 1 tablet (10 mg total) by mouth daily. 90 tablet 3   gabapentin  (NEURONTIN ) 300 MG capsule Take 1 capsule (300 mg total) by mouth 4 (four) times daily. (Patient taking differently: Take 300 mg by mouth as needed.) 360 capsule 3   sulindac  (CLINORIL ) 200 MG tablet Take 1 tablet (200 mg total) by mouth 2 (two) times daily. (Patient taking differently: Take 200 mg by mouth 2 (two) times daily as needed.) 60 tablet 5   tamsulosin  (FLOMAX ) 0.4 MG CAPS capsule Take 1 capsule (0.4 mg total) by mouth daily. 90 capsule 3  triamcinolone  cream (KENALOG ) 0.1 % Apply 1 Application topically daily as needed. 30 g 11   omeprazole (PRILOSEC) 20 MG capsule Take 20 mg by mouth daily. (Patient not taking: Reported on 12/07/2023)     sulindac  (CLINORIL ) 150 MG tablet TAKE 1 TABLET BY MOUTH WITH FOOD TWICE DAILY (Patient not taking: Reported on 12/07/2023)     Vitamin D , Ergocalciferol , (DRISDOL) 1.25 MG (50000 UNIT) CAPS capsule Take 50,000 Units by mouth every 7 (seven) days. (Patient not taking: Reported on 12/07/2023)     No current facility-administered medications for this visit.    Allergies:   Bupivacaine, Metoclopramide, Alendronate, Bisphosphonates, Nsaids, and Oxycodone and oxymorphone    Social  History:  The patient  reports that he has never smoked. He does not have any smokeless tobacco history on file. He reports that he does not drink alcohol and does not use drugs.   Family History:  The patient's family history includes COPD in his mother; Heart disease in his brother and father.    ROS:  Please see the history of present illness.   Otherwise, review of systems are positive for none.   All other systems are reviewed and negative.    PHYSICAL EXAM: VS:  BP 110/80 (BP Location: Left Arm, Patient Position: Sitting, Cuff Size: Large)   Pulse (!) 48   Ht 5' 9 (1.753 m)   Wt 188 lb (85.3 kg)   SpO2 98%   BMI 27.76 kg/m  , BMI Body mass index is 27.76 kg/m. GEN: Well nourished, well developed, in no acute distress  HEENT: normal  Neck: no JVD, carotid bruits, or masses Cardiac: RRR; no murmurs, rubs, or gallops,no edema  Respiratory:  clear to auscultation bilaterally, normal work of breathing GI: soft, nontender, nondistended, + BS MS: no deformity or atrophy  Skin: warm and dry, no rash Neuro:  Strength and sensation are intact Psych: euthymic mood, full affect   EKG:  EKG is ordered today. The ekg ordered today demonstrates: Sinus bradycardia       Recent Labs: 10/19/2023: ALT 30; BUN 24; Creatinine, Ser 1.31; Hemoglobin 14.4; Platelets 258; Potassium 4.3; Sodium 137; TSH 2.750    Lipid Panel    Component Value Date/Time   CHOL 177 10/19/2023 1709   TRIG 58 10/19/2023 1709   HDL 65 10/19/2023 1709   CHOLHDL 2.7 10/19/2023 1709   LDLCALC 101 (H) 10/19/2023 1709      Wt Readings from Last 3 Encounters:  12/07/23 188 lb (85.3 kg)  11/20/23 187 lb 6.4 oz (85 kg)  10/19/23 184 lb (83.5 kg)          06/02/2022   10:29 AM  PAD Screen  Previous PAD dx? No  Previous surgical procedure? No  Pain with walking? No  Feet/toe relief with dangling? No  Painful, non-healing ulcers? No  Extremities discolored? No      ASSESSMENT AND PLAN:  1.   Coronary artery disease involving native coronary arteries without angina: Previous cardiac CTA in 2020 showed mild to moderate nonobstructive coronary artery disease.   Currently with no anginal symptoms.  Continue low-dose aspirin and treatment of risk factors.  2.  Hyperlipidemia: He had myalgia with rosuvastatin  but seems to be doing reasonably well with atorvastatin .  However, his most recent lipid profile in August showed an LDL of 101.  Recommended target LDL of less than 70 and preferably less than 55.  I discussed different management options with him and elected to start ezetimibe 10 mg  daily.  Recheck lipid and liver profile in 2 months.   Disposition:   FU with me in 1 year  Signed,  Deatrice Cage, MD  12/07/2023 12:29 PM    [facility]

## 2024-01-02 ENCOUNTER — Ambulatory Visit: Admitting: Internal Medicine

## 2024-01-02 VITALS — BP 122/70 | HR 60 | Temp 97.5°F | Ht 69.0 in | Wt 184.0 lb

## 2024-01-02 DIAGNOSIS — G4733 Obstructive sleep apnea (adult) (pediatric): Secondary | ICD-10-CM | POA: Diagnosis not present

## 2024-01-02 DIAGNOSIS — Z0130 Encounter for examination of blood pressure without abnormal findings: Secondary | ICD-10-CM

## 2024-01-02 DIAGNOSIS — S83422A Sprain of lateral collateral ligament of left knee, initial encounter: Secondary | ICD-10-CM | POA: Insufficient documentation

## 2024-01-02 DIAGNOSIS — M25561 Pain in right knee: Secondary | ICD-10-CM

## 2024-01-02 MED ORDER — PREDNISONE 20 MG PO TABS: 40.0000 mg | ORAL_TABLET | Freq: Every day | ORAL | 0 refills | Status: AC

## 2024-01-02 MED ORDER — PREDNISONE 20 MG PO TABS: 40.0000 mg | ORAL_TABLET | Freq: Every day | ORAL | 0 refills | Status: DC

## 2024-01-02 NOTE — Progress Notes (Signed)
 Established Patient Office Visit  Subjective:  Patient ID: Kyle Mack, male    DOB: September 14, 1950  Age: 73 y.o. MRN: [id number]  Chief Complaint  Patient presents with   Follow-up    CPAP follow up    No new complaints, here for lab review and medication refills. He is using his cpap machine daily and benefiting from it'Thomasenia Dowse use. No further daytime somnolence. C/o exacerbation of lateral right knee pain which progresses as the day progresses, exacerbated by walking.    No other concerns at this time.   Past Medical History:  Diagnosis Date   Cervical spine pain    GERD (gastroesophageal reflux disease)    Hyperlipidemia    Osteopenia     Past Surgical History:  Procedure Laterality Date   AMPUTATION Left 02/02/2021   Procedure: AMPUTATION DIGIT;  Surgeon: Elisabeth Craig RAMAN, MD;  Location: [facility];  Service: Plastics;  Laterality: Left;   ANKLE SURGERY Right    CARDIAC CATHETERIZATION     1999 no stents   FINGER SURGERY Right 4th digit   ROTATOR CUFF REPAIR Left     Social History   Socioeconomic History   Marital status: Married    Spouse name: Not on file   Number of children: Not on file   Years of education: Not on file   Highest education level: Not on file  Occupational History   Not on file  Tobacco Use   Smoking status: Never   Smokeless tobacco: Not on file  Vaping Use   Vaping status: Never Used  Substance and Sexual Activity   Alcohol use: No    Alcohol/week: 0.0 standard drinks of alcohol   Drug use: No   Sexual activity: Not on file  Other Topics Concern   Not on file  Social History Narrative   Not on file   Social Drivers of Health   Financial Resource Strain: Not on file  Food Insecurity: Not on file  Transportation Needs: Not on file  Physical Activity: Not on file  Stress: Not on file  Social Connections: Not on file  Intimate Partner Violence: Not on file    Family History  Problem Relation Age of Onset   COPD Mother    Heart  disease Father    Heart disease Brother     Allergies  Allergen Reactions   Bupivacaine Other (See Comments)    Hypesthesia per patient   Metoclopramide Other (See Comments)   Alendronate    Bisphosphonates    Nsaids Nausea And Vomiting    Mainly IBU   Oxycodone And Oxymorphone     Severe constipation    Outpatient Medications Prior to Visit  Medication Sig   aspirin EC 81 MG tablet Take 81 mg by mouth daily. (Patient taking differently: Take 81 mg by mouth. Taking 3 times a week.)   atorvastatin  (LIPITOR) 40 MG tablet Take 1 tablet (40 mg total) by mouth daily.   Calcium  Carbonate-Vitamin D  (CALCIUM -VITAMIN D3) 600-125 MG-UNIT TABS Take by mouth daily.    Cholecalciferol (VITAMIN D -3) 125 MCG (5000 UT) TABS Take by mouth once a week.   dutasteride (AVODART) 0.5 MG capsule Take 0.5 mg by mouth daily. (Patient taking differently: Take 0.5 mg by mouth. Takes 2 times weekly.)   ezetimibe (ZETIA) 10 MG tablet Take 1 tablet (10 mg total) by mouth daily.   gabapentin  (NEURONTIN ) 300 MG capsule Take 1 capsule (300 mg total) by mouth 4 (four) times daily. (Patient taking differently: Take 300  mg by mouth as needed.)   omeprazole (PRILOSEC) 20 MG capsule Take 20 mg by mouth daily. (Patient not taking: Reported on 12/07/2023)   sulindac  (CLINORIL ) 150 MG tablet TAKE 1 TABLET BY MOUTH WITH FOOD TWICE DAILY (Patient not taking: Reported on 12/07/2023)   sulindac  (CLINORIL ) 200 MG tablet Take 1 tablet (200 mg total) by mouth 2 (two) times daily. (Patient taking differently: Take 200 mg by mouth 2 (two) times daily as needed.)   tamsulosin  (FLOMAX ) 0.4 MG CAPS capsule Take 1 capsule (0.4 mg total) by mouth daily.   triamcinolone  cream (KENALOG ) 0.1 % Apply 1 Application topically daily as needed.   Vitamin D , Ergocalciferol , (DRISDOL) 1.25 MG (50000 UNIT) CAPS capsule Take 50,000 Units by mouth every 7 (seven) days. (Patient not taking: Reported on 12/07/2023)   No facility-administered medications  prior to visit.    Review of Systems  Constitutional:  Negative for malaise/fatigue and weight loss.  HENT:  Positive for tinnitus.   Eyes: Negative.   Respiratory: Negative.    Cardiovascular:  Negative for chest pain and palpitations.  Gastrointestinal: Negative.   Genitourinary:  Positive for frequency and urgency.  Musculoskeletal:  Positive for joint pain.  Neurological: Negative.   Endo/Heme/Allergies: Negative.        Objective:   BP 122/70   Pulse 60   Temp (!) 97.5 F (36.4 C)   Ht 5' 9 (1.753 m)   Wt 184 lb (83.5 kg)   SpO2 96%   BMI 27.17 kg/m   Vitals:   01/02/24 1045  BP: 122/70  Pulse: 60  Temp: (!) 97.5 F (36.4 C)  Height: 5' 9 (1.753 m)  Weight: 184 lb (83.5 kg)  SpO2: 96%  BMI (Calculated): 27.16    Physical Exam Musculoskeletal:     Right knee: No swelling. Normal range of motion. Tenderness present over the LCL.      No results found for any visits on 01/02/24.  Recent Results (from the past 2160 hours)  Comprehensive metabolic panel with GFR     Status: Abnormal   Collection Time: 10/19/23  5:09 PM  Result Value Ref Range   Glucose 85 70 - 99 mg/dL   BUN 24 8 - 27 mg/dL   Creatinine, Ser 8.68 (H) 0.76 - 1.27 mg/dL   eGFR 57 (L) >40 fO/fpw/8.26   BUN/Creatinine Ratio 18 10 - 24   Sodium 137 134 - 144 mmol/L   Potassium 4.3 3.5 - 5.2 mmol/L   Chloride 101 96 - 106 mmol/L   CO2 21 20 - 29 mmol/L   Calcium  10.5 (H) 8.6 - 10.2 mg/dL   Total Protein 6.8 6.0 - 8.5 g/dL   Albumin 4.6 3.8 - 4.8 g/dL   Globulin, Total 2.2 1.5 - 4.5 g/dL   Bilirubin Total 0.5 0.0 - 1.2 mg/dL   Alkaline Phosphatase 62 44 - 121 IU/L   AST 26 0 - 40 IU/L   ALT 30 0 - 44 IU/L  Lipid panel     Status: Abnormal   Collection Time: 10/19/23  5:09 PM  Result Value Ref Range   Cholesterol, Total 177 100 - 199 mg/dL   Triglycerides 58 0 - 149 mg/dL   HDL 65 >60 mg/dL   VLDL Cholesterol Cal 11 5 - 40 mg/dL   LDL Chol Calc (NIH) 898 (H) 0 - 99 mg/dL    Chol/HDL Ratio 2.7 0.0 - 5.0 ratio    Comment:  T. Chol/HDL Ratio                                             Men  Women                               1/2 Avg.Risk  3.4    3.3                                   Avg.Risk  5.0    4.4                                2X Avg.Risk  9.6    7.1                                3X Avg.Risk 23.4   11.0   CBC With Diff/Platelet     Status: None   Collection Time: 10/19/23  5:09 PM  Result Value Ref Range   WBC 6.6 3.4 - 10.8 x10E3/uL   RBC 4.96 4.14 - 5.80 x10E6/uL   Hemoglobin 14.4 13.0 - 17.7 g/dL   Hematocrit 55.8 62.4 - 51.0 %   MCV 89 79 - 97 fL   MCH 29.0 26.6 - 33.0 pg   MCHC 32.7 31.5 - 35.7 g/dL   RDW 86.6 88.3 - 84.5 %   Platelets 258 150 - 450 x10E3/uL   Neutrophils 59 Not Estab. %   Lymphs 31 Not Estab. %   Monocytes 8 Not Estab. %   Eos 1 Not Estab. %   Basos 1 Not Estab. %   Neutrophils Absolute 3.9 1.4 - 7.0 x10E3/uL   Lymphocytes Absolute 2.0 0.7 - 3.1 x10E3/uL   Monocytes Absolute 0.6 0.1 - 0.9 x10E3/uL   EOS (ABSOLUTE) 0.1 0.0 - 0.4 x10E3/uL   Basophils Absolute 0.0 0.0 - 0.2 x10E3/uL   Immature Granulocytes 0 Not Estab. %   Immature Grans (Abs) 0.0 0.0 - 0.1 x10E3/uL  PSA     Status: None   Collection Time: 10/19/23  5:09 PM  Result Value Ref Range   Prostate Specific Ag, Serum 1.6 0.0 - 4.0 ng/mL    Comment: Roche ECLIA methodology. According to the American Urological Association, Serum PSA should decrease and remain at undetectable levels after radical prostatectomy. The AUA defines biochemical recurrence as an initial PSA value 0.2 ng/mL or greater followed by a subsequent confirmatory PSA value 0.2 ng/mL or greater. Values obtained with different assay methods or kits cannot be used interchangeably. Results cannot be interpreted as absolute evidence of the presence or absence of malignant disease.   Vitamin D  (25 hydroxy)     Status: None   Collection Time: 10/19/23  5:09 PM   Result Value Ref Range   Vit D, 25-Hydroxy 33.1 30.0 - 100.0 ng/mL    Comment: Vitamin D  deficiency has been defined by the Institute of Medicine and an Endocrine Society practice guideline as a level of serum 25-OH vitamin D  less than 20 ng/mL (1,2). The Endocrine Society went on to further define vitamin D  insufficiency as a level between 21 and 29 ng/mL (2). 1. IOM (  Institute of Medicine). 2010. Dietary reference    intakes for calcium  and D. [place]: The    [employer]. 2. Holick MF, Binkley [place], Bischoff-Ferrari HA, et al.    Evaluation, treatment, and prevention of vitamin D     deficiency: an Endocrine Society clinical practice    guideline. JCEM. 2011 Jul; 96(7):1911-30.   TSH     Status: None   Collection Time: 10/19/23  5:09 PM  Result Value Ref Range   TSH 2.750 0.450 - 4.500 uIU/mL      Assessment & Plan:   Patient Active Problem List   Diagnosis Date Noted   Sprain of lateral collateral ligament of left knee 01/02/2024   OSA on CPAP 01/02/2024   Overweight (BMI 25.0-29.9) 07/10/2016   Chronic pain syndrome 07/03/2016   Cervical foraminal stenosis (Right: C3-4) (Bilateral, R>L: C5-6) (Left: C6-7) 02/18/2015   Cervical facet hypertrophy (Right C2-3) (Bilateral, L>R: C6-7) (Bilateral, R>L: C7-T1) 02/18/2015   Neurogenic pain 02/18/2015   Neuropathic pain 02/18/2015   Abnormal MRI, cervical spine (02/17/2015) 02/18/2015   Cervical spondylosis with radiculopathy (sensory) (Left) 02/08/2015   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 Keep appt.   Total time spent: 20 minutes. This time includes review of previous notes and results and patient face to face interaction during today'Fabion Gatson visit.    Sherrill Cinderella Perry, MD  01/02/2024   This document may have been prepared by [facility] Voice Recognition software and as such may include unintentional dictation errors.

## 2024-01-11 ENCOUNTER — Other Ambulatory Visit: Payer: Self-pay

## 2024-01-11 ENCOUNTER — Emergency Department (HOSPITAL_BASED_OUTPATIENT_CLINIC_OR_DEPARTMENT_OTHER)

## 2024-01-11 ENCOUNTER — Emergency Department (HOSPITAL_BASED_OUTPATIENT_CLINIC_OR_DEPARTMENT_OTHER)
Admission: EM | Admit: 2024-01-11 | Discharge: 2024-01-11 | Disposition: A | Discharge status: Home / Self Care | Attending: Emergency Medicine | Admitting: Emergency Medicine

## 2024-01-11 DIAGNOSIS — M546 Pain in thoracic spine: Secondary | ICD-10-CM | POA: Diagnosis present

## 2024-01-11 DIAGNOSIS — Z7982 Long term (current) use of aspirin: Secondary | ICD-10-CM | POA: Insufficient documentation

## 2024-01-11 DIAGNOSIS — M549 Dorsalgia, unspecified: Secondary | ICD-10-CM

## 2024-01-11 LAB — BASIC METABOLIC PANEL WITH GFR
Anion gap: 9 (ref 5–15)
BUN: 27 mg/dL — ABNORMAL HIGH (ref 8–23)
CO2: 26 mmol/L (ref 22–32)
Calcium: 10 mg/dL (ref 8.9–10.3)
Chloride: 104 mmol/L (ref 98–111)
Creatinine, Ser: 1.05 mg/dL (ref 0.61–1.24)
GFR, Estimated: >60 mL/min (ref >60)
Glucose, Bld: 102 mg/dL — ABNORMAL HIGH (ref 70–99)
Potassium: 4.5 mmol/L (ref 3.5–5.1)
Sodium: 139 mmol/L (ref 135–145)

## 2024-01-11 LAB — CBC
HCT: 43.2 % (ref 39.0–52.0)
Hemoglobin: 14.2 g/dL (ref 13.0–17.0)
MCH: 29.2 pg (ref 26.0–34.0)
MCHC: 32.9 g/dL (ref 30.0–36.0)
MCV: 88.7 fL (ref 80.0–100.0)
Platelets: 250 K/uL (ref 150–400)
RBC: 4.87 MIL/uL (ref 4.22–5.81)
RDW: 13.8 % (ref 11.5–15.5)
WBC: 7 K/uL (ref 4.0–10.5)
nRBC: 0 % (ref 0.0–0.2)

## 2024-01-11 LAB — TROPONIN T, HIGH SENSITIVITY: Troponin T High Sensitivity: <15 ng/L (ref 0–19)

## 2024-01-11 MED ORDER — ACETAMINOPHEN 500 MG PO TABS
1000.0000 mg | ORAL_TABLET | Freq: Once | ORAL | Status: DC
  Filled 2024-01-11: qty 2

## 2024-01-11 MED ORDER — IOHEXOL 350 MG/ML SOLN
100.0000 mL | Freq: Once | INTRAVENOUS | Status: AC | PRN
  Administered 2024-01-11: 100 mL via INTRAVENOUS

## 2024-01-11 NOTE — Discharge Instructions (Addendum)
 Evaluation for your back pain was overall reassuring.  Recommend conservative treatment by applying ice 3-4 times a day and you can take Tylenol  and NSAIDs.  Please follow-up with PCP.

## 2024-01-11 NOTE — ED Triage Notes (Signed)
 C/o mid to lower back pain since Monday night after changing oil in a couple of cars Denies urinary involvement.  Ambulatory.

## 2024-01-11 NOTE — ED Provider Notes (Signed)
  EMERGENCY DEPARTMENT AT [facility] Provider Note   CSN: [id number] Arrival date & time: 01/11/24  0940     Patient presents with: Back Pain  HPI Kyle Mack is a 73 y.o. male with chronic pain, hyperlipidemia and GERD presenting for midthoracic back pain.  He states the pain started while he was driving in his car.  The pain is in the middle of his thoracic back and at times radiates around his ribs.  He denies chest pain shortness of breath.  He did report that he was changing oil in a couple of cars the night before.  He denies any history of blood clots, calf tenderness or swelling.  He is concerned that it could be a blood clot or MI or something else.  He states he has never had pain like this before.    Back Pain      Prior to Admission medications   Medication Sig Start Date End Date Taking? Authorizing Provider  aspirin EC 81 MG tablet Take 81 mg by mouth daily. Patient taking differently: Take 81 mg by mouth. Taking 3 times a week.    [provider]  atorvastatin  (LIPITOR) 40 MG tablet Take 1 tablet (40 mg total) by mouth daily. 10/19/23 10/18/24  Albina GORMAN Dine, MD  Calcium  Carbonate-Vitamin D  (CALCIUM -VITAMIN D3) 600-125 MG-UNIT TABS Take by mouth daily.     [provider]  Cholecalciferol (VITAMIN D -3) 125 MCG (5000 UT) TABS Take by mouth once a week.    [provider]  dutasteride (AVODART) 0.5 MG capsule Take 0.5 mg by mouth daily. Patient taking differently: Take 0.5 mg by mouth. Takes 2 times weekly.    [provider]  ezetimibe (ZETIA) 10 MG tablet Take 1 tablet (10 mg total) by mouth daily. 12/07/23 03/06/24  Darron Deatrice LABOR, MD  gabapentin  (NEURONTIN ) 300 MG capsule Take 1 capsule (300 mg total) by mouth 4 (four) times daily. Patient taking differently: Take 300 mg by mouth as needed. 07/25/16 12/07/23  Myrna Camelia HERO, NP  sulindac  (CLINORIL ) 200 MG tablet Take 1 tablet (200 mg total) by mouth 2 (two)  times daily. Patient taking differently: Take 200 mg by mouth 2 (two) times daily as needed. 10/19/23 04/16/24  Albina GORMAN Dine, MD  tamsulosin  (FLOMAX ) 0.4 MG CAPS capsule Take 1 capsule (0.4 mg total) by mouth daily. 10/19/23 10/18/24  Albina GORMAN Dine, MD  triamcinolone  cream (KENALOG ) 0.1 % Apply 1 Application topically daily as needed. 10/19/23 10/18/24  Albina GORMAN Dine, MD    Allergies: Bupivacaine, Metoclopramide, Alendronate, Bisphosphonates, Nsaids, and Oxycodone and oxymorphone    Review of Systems  Musculoskeletal:  Positive for back pain.    Updated Vital Signs BP 129/82   Pulse (!) 54   Temp 98.4 F (36.9 C)   Resp 16   SpO2 100%   Physical Exam Vitals and nursing note reviewed.  HENT:     Head: Normocephalic and atraumatic.     Mouth/Throat:     Mouth: Mucous membranes are moist.  Eyes:     General:        Right eye: No discharge.        Left eye: No discharge.     Conjunctiva/sclera: Conjunctivae normal.  Cardiovascular:     Rate and Rhythm: Normal rate and regular rhythm.     Pulses: Normal pulses.          Carotid pulses are 2+ on the right side and 2+ on the left side.  Dorsalis pedis pulses are 2+ on the right side and 2+ on the left side.     Heart sounds: Normal heart sounds.  Pulmonary:     Effort: Pulmonary effort is normal.     Breath sounds: Normal breath sounds.  Abdominal:     General: Abdomen is flat.     Palpations: Abdomen is soft.  Musculoskeletal:       Arms:  Skin:    General: Skin is warm and dry.  Neurological:     General: No focal deficit present.  Psychiatric:        Mood and Affect: Mood normal.     (all labs ordered are listed, but only abnormal results are displayed) Labs Reviewed  BASIC METABOLIC PANEL WITH GFR - Abnormal; Notable for the following components:      Result Value   Glucose, Bld 102 (*)    BUN 27 (*)    All other components within normal limits  CBC  TROPONIN T, HIGH SENSITIVITY     EKG: EKG Interpretation Date/Time:  Friday January 11 2024 11:09:21 EST Ventricular Rate:  57 PR Interval:  150 QRS Duration:  101 QT Interval:  398 QTC Calculation: 388 R Axis:   62  Text Interpretation: Sinus rhythm Abnormal R-wave progression, early transition No significant change since last tracing Confirmed by Doretha Folks ([id number]) on 01/11/2024 11:18:15 AM  Radiology: CT L-SPINE NO CHARGE Result Date: 01/11/2024 EXAM: CT OF THE LUMBAR SPINE WITHOUT CONTRAST 01/11/2024 01:23:12 PM TECHNIQUE: CT of the lumbar spine was performed without the administration of intravenous contrast. Multiplanar reformatted images are provided for review. Automated exposure control, iterative reconstruction, and/or weight based adjustment of the mA/kV was utilized to reduce the radiation dose to as low as reasonably achievable. COMPARISON: None available. CLINICAL HISTORY: FINDINGS: BONES AND ALIGNMENT: 5 non rib bearing lumbar segment labeled L1-L5. Normal alignment. No acute fracture or suspicious bone lesion. DEGENERATIVE CHANGES: Schmorl nodes adjacent to interspaces L2-L5. Mild narrowing of L2-L3 and L3-L4 interspaces with small anterior endplate spurs. Early vacuum phenomenon L4-L5 and L5-S1 with small anterior endplate spurs. SOFT TISSUES: No paraspinal mass or hematoma. See concurrent CTA chest/abdomen/pelvis for further soft tissue evaluation. IMPRESSION: 1. No fracture or other acute findings. 2. Mild degenerative disc disease at L2-L3 and L3-L4 with small anterior endplate spurs. 3. Early vacuum phenomenon at L4-L5 and L5-S1 with small anterior endplate spurs. Electronically signed by: Katheleen Faes MD 01/11/2024 03:54 PM EST RP Workstation: [id number]   CT T-SPINE NO CHARGE Result Date: 01/11/2024 EXAM: CT THORACIC SPINE WITHOUT CONTRAST 01/11/2024 01:23:12 PM TECHNIQUE: CT of the thoracic spine was generated from CT chest/abdomen/pelvis with contrast. Multiplanar reformatted images are  provided for review. Automated exposure control, iterative reconstruction, and/or weight based adjustment of the mA/kV was utilized to reduce the radiation dose to as low as reasonably achievable. COMPARISON: 11/27/2018. CLINICAL HISTORY: mid to lower back pain since Monday night after changing oil in a couple of cars Denies urinary involvement. FINDINGS: BONES AND ALIGNMENT: 12 rib bearing thoracic segments. Normal vertebral body heights. No acute fracture or suspicious bone lesion. Normal alignment. Anterior endplate spurring at all levels T4 through T12. Moderate cervical spondylotic change C6-C7 incidentally noted. DEGENERATIVE CHANGES: Bilateral facet degenerative joint disease mild T10-T11, moderate T11-T12. SOFT TISSUES: No paraspinal mass or hematoma. see concurrent CTA chest/abdomen/pelvis for additional soft tissue evaluation. IMPRESSION: 1. No acute abnormality of the thoracic spine. Electronically signed by: Dayne Hassell MD 01/11/2024 03:10 PM EST RP Workstation: [id number]   CT  Angio Chest/Abd/Pel for Dissection W and/or W/WO Result Date: 01/11/2024 EXAM: CTA CHEST, ABDOMEN AND PELVIS WITHOUT AND WITH CONTRAST 01/11/2024 01:23:12 PM TECHNIQUE: CTA of the chest was performed without and with the administration of 100 mL of intravenous iohexol  (OMNIPAQUE ) 350 MG/ML injection. CTA of the abdomen and pelvis was performed without and with the administration of 100 mL of intravenous iohexol  (OMNIPAQUE ) 350 MG/ML injection. Multiplanar reformatted images are provided for review. MIP images are provided for review. Automated exposure control, iterative reconstruction, and/or weight based adjustment of the mA/kV was utilized to reduce the radiation dose to as low as reasonably achievable. COMPARISON: 11/27/2018 CLINICAL HISTORY: Acute aortic syndrome (AAS) suspected. FINDINGS: VASCULATURE: AORTA: Mild aortoiliac calcification and plaque without aneurysm. No dissection. PULMONARY ARTERIES: No pulmonary  embolism with the limits of this exam. GREAT VESSELS OF AORTIC ARCH: No acute finding. No dissection. No arterial occlusion or significant stenosis. CELIAC TRUNK: No acute finding. No occlusion or significant stenosis. SUPERIOR MESENTERIC ARTERY: No acute finding. No occlusion or significant stenosis. INFERIOR MESENTERIC ARTERY: No acute finding. No occlusion or significant stenosis. RENAL ARTERIES: Duplicated left renal artery, superior dominant, both patent. Single right renal artery, patent. No acute finding. No occlusion or significant stenosis. ILIAC ARTERIES: Mild aortoiliac calcification and plaque without aneurysm. No acute finding. No occlusion or significant stenosis. CHEST: MEDIASTINUM: No mediastinal lymphadenopathy. The heart and pericardium demonstrate no acute abnormality. Scattered LAD coronary calcifications. LUNGS AND PLEURA: Dependent atelectasis at the lung bases. Stable left perifissural nodule (4:84). No focal consolidation or pulmonary edema. No evidence of pleural effusion or pneumothorax. THORACIC BONES AND SOFT TISSUES: Vertebral endplate spurring at multiple levels in the lower thoracic spine. Postop change in the left humeral head. ABDOMEN AND PELVIS: LIVER: The liver is unremarkable. GALLBLADDER AND BILE DUCTS: Gallbladder is unremarkable. No biliary ductal dilatation. SPLEEN: The spleen is unremarkable. PANCREAS: The pancreas is unremarkable. ADRENAL GLANDS: Bilateral adrenal glands demonstrate no acute abnormality. KIDNEYS, URETERS AND BLADDER: No stones in the kidneys or ureters. No hydronephrosis. No perinephric or periureteral stranding. Bifid right renal collecting system. There is mild right pelvic and ureteral ectasia without calculus. GI AND BOWEL: Stomach and duodenal sweep demonstrate no acute abnormality. There is no bowel obstruction. No abnormal bowel wall thickening or distension. Bowel nondistended. REPRODUCTIVE: Prostate enlargement with internal metallic densities.  PERITONEUM AND RETROPERITONEUM: No ascites or free air. LYMPH NODES: No lymphadenopathy. ABDOMINAL BONES AND SOFT TISSUES: Schmorls. nodes involving L3 and L5 endplates. No acute abnormality of the bones. No acute soft tissue abnormality. IMPRESSION: 1. No acute aortic syndrome. 2. Mild aortoiliac atherosclerosis without aneurysm. Electronically signed by: Dayne Hassell MD 01/11/2024 03:04 PM EST RP Workstation: [id number]   DG Chest Port 1 View Result Date: 01/11/2024 EXAM: 1 VIEW(S) XRAY OF THE CHEST 01/11/2024 11:21:00 AM COMPARISON: None available. CLINICAL HISTORY: chest pain and mid to lower back pain FINDINGS: LUNGS AND PLEURA: No focal pulmonary opacity. No pulmonary edema. No pleural effusion. No pneumothorax. HEART AND MEDIASTINUM: No acute abnormality of the cardiac and mediastinal silhouettes. BONES AND SOFT TISSUES: Left humeral head rotator cuff anchors are present. Multilevel thoracic osteophytosis. IMPRESSION: 1. No acute cardiopulmonary abnormality. Electronically signed by: Rogelia Myers MD 01/11/2024 12:09 PM EST RP Workstation: [id number]     Procedures   Medications Ordered in the ED  acetaminophen  (TYLENOL ) tablet 1,000 mg (1,000 mg Oral Not Given 01/11/24 1330)  iohexol  (OMNIPAQUE ) 350 MG/ML injection 100 mL (100 mLs Intravenous Contrast Given 01/11/24 1238)  Medical Decision Making Amount and/or Complexity of Data Reviewed Labs: ordered. Radiology: ordered.  Risk OTC drugs. Prescription drug management.   Initial Impression and Ddx 73 year old well-appearing male presenting for midthoracic back pain.  Exam notable for point tenderness in the mid thoracic back.  DDx includes malignancy, aortic dissection, PE, ACS, MSK etiology, other.  Overall initially felt that he was well-appearing nontoxic and in no acute distress but patient was adamant that this pain was different and quite severe.  For this reason felt it warranted thorough  evaluation with CT scans and chest pain workup as well. Patient PMH that increases complexity of ED encounter:  chronic pain, hyperlipidemia and GERD   Interpretation of Diagnostics - I independent reviewed and interpreted the labs as followed: BUN 27, negative troponin  - I independently visualized the following imaging with scope of interpretation limited to determining acute life threatening conditions related to emergency care: CT scans and xrays, which revealed no acute findings.  Findings shared with patient  -I personally reviewed and interpreted EKG which revealed sinus rhythm without evidence of ischemia  Patient Reassessment and Ultimate Disposition/Management Overall workup was reassuring.  CT scans and x-rays were negative for acute injuries.  Suspect this is of musculoskeletal etiology.  Advised supportive treatment with NSAIDs and to follow-up with his PCP.  Discussed return precautions.  Discharged.  Patient management required discussion with the following services or consulting groups:  None  Complexity of Problems Addressed Acute complicated illness or Injury  Additional Data Reviewed and Analyzed Further history obtained from: Past medical history and medications listed in the EMR and Prior ED visit notes  Patient Encounter Risk Assessment Consideration of hospitalization      Final diagnoses:  Back pain, unspecified back location, unspecified back pain laterality, unspecified chronicity    ED Discharge Orders     None          Lang Norleen POUR, PA-C 01/11/24 1605    Doretha Folks, MD 01/16/24 1639

## 2024-02-20 ENCOUNTER — Other Ambulatory Visit: Payer: Self-pay

## 2024-02-20 LAB — LIPID PANEL
Chol/HDL Ratio: 2.3 ratio (ref 0.0–5.0)
Cholesterol, Total: 122 mg/dL (ref 100–199)
HDL: 54 mg/dL (ref >39)
LDL Chol Calc (NIH): 55 mg/dL (ref 0–99)
Triglycerides: 60 mg/dL (ref 0–149)
VLDL Cholesterol Cal: 13 mg/dL (ref 5–40)

## 2024-02-20 LAB — HEPATIC FUNCTION PANEL
ALT: 40 IU/L (ref 0–44)
AST: 28 IU/L (ref 0–40)
Albumin: 4.5 g/dL (ref 3.8–4.8)
Alkaline Phosphatase: 84 IU/L (ref 47–123)
Bilirubin Total: 0.6 mg/dL (ref 0.0–1.2)
Bilirubin, Direct: 0.22 mg/dL (ref 0.00–0.40)
Total Protein: 6.8 g/dL (ref 6.0–8.5)

## 2024-02-20 MED ORDER — GABAPENTIN 300 MG PO CAPS: 300.0000 mg | ORAL_CAPSULE | ORAL | 3 refills | Status: DC | PRN

## 2024-02-21 ENCOUNTER — Other Ambulatory Visit: Payer: Self-pay

## 2024-02-21 ENCOUNTER — Ambulatory Visit: Payer: Self-pay | Admitting: Cardiovascular Disease

## 2024-02-25 ENCOUNTER — Other Ambulatory Visit: Payer: Self-pay

## 2024-11-14 ENCOUNTER — Ambulatory Visit: Admitting: Internal Medicine
